# Patient Record
Sex: Male | Born: 1955 | Hispanic: Yes | Marital: Single | Smoking: Former smoker
Health system: Southern US, Community
[De-identification: ages and names within clinical notes are randomized; demographics above are authoritative.]

## PROBLEM LIST (undated history)

## (undated) DIAGNOSIS — I10 Essential (primary) hypertension: Secondary | ICD-10-CM

## (undated) DIAGNOSIS — I2510 Atherosclerotic heart disease of native coronary artery without angina pectoris: Secondary | ICD-10-CM

## (undated) DIAGNOSIS — G8929 Other chronic pain: Secondary | ICD-10-CM

## (undated) DIAGNOSIS — E785 Hyperlipidemia, unspecified: Secondary | ICD-10-CM

## (undated) DIAGNOSIS — I480 Paroxysmal atrial fibrillation: Secondary | ICD-10-CM

## (undated) DIAGNOSIS — I5189 Other ill-defined heart diseases: Secondary | ICD-10-CM

## (undated) HISTORY — DX: Paroxysmal atrial fibrillation: I48.0

## (undated) HISTORY — DX: Hyperlipidemia, unspecified: E78.5

## (undated) HISTORY — DX: Atherosclerotic heart disease of native coronary artery without angina pectoris: I25.10

## (undated) HISTORY — DX: Other ill-defined heart diseases: I51.89

---

## 2016-12-06 ENCOUNTER — Emergency Department
Admission: EM | Admit: 2016-12-06 | Discharge: 2016-12-06 | Disposition: A | Discharge status: Other Acute Inpatient Hospital | Payer: Self-pay | Attending: Emergency Medicine | Admitting: Emergency Medicine

## 2016-12-06 ENCOUNTER — Encounter (HOSPITAL_COMMUNITY)
Admission: EM | Disposition: A | Discharge status: Home / Self Care | Payer: Self-pay | Source: Home / Self Care | Attending: Cardiology

## 2016-12-06 ENCOUNTER — Encounter (HOSPITAL_COMMUNITY): Payer: Self-pay | Admitting: *Deleted

## 2016-12-06 ENCOUNTER — Inpatient Hospital Stay (HOSPITAL_COMMUNITY)
Admission: EM | Admit: 2016-12-06 | Discharge: 2016-12-09 | DRG: 247 | Disposition: A | Discharge status: Home / Self Care | Payer: Self-pay | Attending: Cardiology | Admitting: Cardiology

## 2016-12-06 DIAGNOSIS — E785 Hyperlipidemia, unspecified: Secondary | ICD-10-CM

## 2016-12-06 DIAGNOSIS — K5900 Constipation, unspecified: Secondary | ICD-10-CM | POA: Diagnosis not present

## 2016-12-06 DIAGNOSIS — I2119 ST elevation (STEMI) myocardial infarction involving other coronary artery of inferior wall: Principal | ICD-10-CM | POA: Diagnosis present

## 2016-12-06 DIAGNOSIS — I2510 Atherosclerotic heart disease of native coronary artery without angina pectoris: Secondary | ICD-10-CM

## 2016-12-06 DIAGNOSIS — I480 Paroxysmal atrial fibrillation: Secondary | ICD-10-CM | POA: Diagnosis present

## 2016-12-06 DIAGNOSIS — I213 ST elevation (STEMI) myocardial infarction of unspecified site: Secondary | ICD-10-CM

## 2016-12-06 DIAGNOSIS — I4891 Unspecified atrial fibrillation: Secondary | ICD-10-CM | POA: Diagnosis present

## 2016-12-06 DIAGNOSIS — Z9861 Coronary angioplasty status: Secondary | ICD-10-CM

## 2016-12-06 DIAGNOSIS — I2111 ST elevation (STEMI) myocardial infarction involving right coronary artery: Secondary | ICD-10-CM | POA: Insufficient documentation

## 2016-12-06 DIAGNOSIS — I10 Essential (primary) hypertension: Secondary | ICD-10-CM

## 2016-12-06 HISTORY — DX: Essential (primary) hypertension: I10

## 2016-12-06 HISTORY — DX: Atherosclerotic heart disease of native coronary artery without angina pectoris: I25.10

## 2016-12-06 HISTORY — DX: Other chronic pain: G89.29

## 2016-12-06 HISTORY — PX: CARDIAC CATHETERIZATION: SHX172

## 2016-12-06 LAB — PROTIME-INR
INR: 0.93
Prothrombin Time: 12.5 seconds (ref 11.4–15.2)

## 2016-12-06 LAB — BASIC METABOLIC PANEL
Anion gap: 10 (ref 5–15)
BUN: 19 mg/dL (ref 6–20)
CALCIUM: 8.8 mg/dL — AB (ref 8.9–10.3)
CHLORIDE: 108 mmol/L (ref 101–111)
CO2: 19 mmol/L — ABNORMAL LOW (ref 22–32)
CREATININE: 1.09 mg/dL (ref 0.61–1.24)
GFR calc non Af Amer: >60 mL/min (ref >60)
Glucose, Bld: 123 mg/dL — ABNORMAL HIGH (ref 65–99)
Potassium: 4.9 mmol/L (ref 3.5–5.1)
SODIUM: 137 mmol/L (ref 135–145)

## 2016-12-06 LAB — CBC
HCT: 41.5 % (ref 39.0–52.0)
HEMATOCRIT: 47.3 % (ref 40.0–52.0)
HEMOGLOBIN: 13.9 g/dL (ref 13.0–17.0)
Hemoglobin: 15.8 g/dL (ref 13.0–18.0)
MCH: 30.9 pg (ref 26.0–34.0)
MCH: 30.5 pg (ref 26.0–34.0)
MCHC: 33.4 g/dL (ref 32.0–36.0)
MCHC: 33.5 g/dL (ref 30.0–36.0)
MCV: 92.4 fL (ref 80.0–100.0)
MCV: 91.2 fL (ref 78.0–100.0)
PLATELETS: 235 10*3/uL (ref 150–440)
Platelets: 213 10*3/uL (ref 150–400)
RBC: 5.12 MIL/uL (ref 4.40–5.90)
RBC: 4.55 MIL/uL (ref 4.22–5.81)
RDW: 13.6 % (ref 11.5–14.5)
RDW: 13.5 % (ref 11.5–15.5)
WBC: 9.8 10*3/uL (ref 3.8–10.6)
WBC: 8.4 10*3/uL (ref 4.0–10.5)

## 2016-12-06 LAB — TROPONIN I
TROPONIN I: 30.68 ng/mL — AB (ref <0.03)
TROPONIN I: 60.88 ng/mL — AB (ref <0.03)
Troponin I: 0.11 ng/mL (ref <0.03)

## 2016-12-06 LAB — APTT: APTT: 24 s (ref 24–36)

## 2016-12-06 LAB — MRSA PCR SCREENING: MRSA by PCR: NEGATIVE

## 2016-12-06 LAB — TSH: TSH: 1.463 u[IU]/mL (ref 0.350–4.500)

## 2016-12-06 SURGERY — LEFT HEART CATH AND CORONARY ANGIOGRAPHY

## 2016-12-06 MED ORDER — ONDANSETRON HCL 4 MG/2ML IJ SOLN
4.0000 mg | Freq: Once | INTRAMUSCULAR | Status: AC
  Administered 2016-12-06: 4 mg via INTRAVENOUS

## 2016-12-06 MED ORDER — LIDOCAINE HCL (PF) 1 % IJ SOLN
INTRAMUSCULAR | Status: DC | PRN
  Administered 2016-12-06: 2 mL via INTRADERMAL

## 2016-12-06 MED ORDER — TICAGRELOR 90 MG PO TABS: 90.0000 mg | ORAL_TABLET | Freq: Two times a day (BID) | ORAL | Status: DC

## 2016-12-06 MED ORDER — ASPIRIN 81 MG PO CHEW: 81.0000 mg | CHEWABLE_TABLET | Freq: Every day | ORAL | Status: DC

## 2016-12-06 MED ORDER — TICAGRELOR 90 MG PO TABS
90.0000 mg | ORAL_TABLET | Freq: Two times a day (BID) | ORAL | Status: DC
  Administered 2016-12-06 – 2016-12-09 (×6): 90 mg via ORAL
  Filled 2016-12-06 (×6): qty 1

## 2016-12-06 MED ORDER — ASPIRIN 81 MG PO CHEW
81.0000 mg | CHEWABLE_TABLET | Freq: Every day | ORAL | Status: DC
  Administered 2016-12-07 – 2016-12-09 (×3): 81 mg via ORAL
  Filled 2016-12-06 (×3): qty 1

## 2016-12-06 MED ORDER — VERAPAMIL HCL 2.5 MG/ML IV SOLN
INTRAVENOUS | Status: AC
  Filled 2016-12-06: qty 2

## 2016-12-06 MED ORDER — ONDANSETRON HCL 4 MG/2ML IJ SOLN
INTRAMUSCULAR | Status: DC | PRN
  Administered 2016-12-06: 4 mg via INTRAVENOUS

## 2016-12-06 MED ORDER — ATORVASTATIN CALCIUM 80 MG PO TABS: 80.0000 mg | ORAL_TABLET | Freq: Every day | ORAL | Status: DC

## 2016-12-06 MED ORDER — PANTOPRAZOLE SODIUM 40 MG PO TBEC
40.0000 mg | DELAYED_RELEASE_TABLET | Freq: Every day | ORAL | Status: DC
  Administered 2016-12-06 – 2016-12-09 (×4): 40 mg via ORAL
  Filled 2016-12-06 (×4): qty 1

## 2016-12-06 MED ORDER — SODIUM CHLORIDE 0.9 % IV SOLN
INTRAVENOUS | Status: DC | PRN
  Administered 2016-12-06: 100 mL/h via INTRAVENOUS

## 2016-12-06 MED ORDER — ASPIRIN 81 MG PO CHEW
324.0000 mg | CHEWABLE_TABLET | Freq: Once | ORAL | Status: AC
  Administered 2016-12-06: 324 mg via ORAL

## 2016-12-06 MED ORDER — IOPAMIDOL (ISOVUE-370) INJECTION 76%
INTRAVENOUS | Status: DC | PRN
  Administered 2016-12-06: 125 mL via INTRA_ARTERIAL

## 2016-12-06 MED ORDER — ZOLPIDEM TARTRATE 5 MG PO TABS: 5.0000 mg | ORAL_TABLET | Freq: Every evening | ORAL | Status: DC | PRN

## 2016-12-06 MED ORDER — SODIUM CHLORIDE 0.9% FLUSH
3.0000 mL | Freq: Two times a day (BID) | INTRAVENOUS | Status: DC
  Administered 2016-12-06 – 2016-12-08 (×4): 3 mL via INTRAVENOUS

## 2016-12-06 MED ORDER — ALPRAZOLAM 0.25 MG PO TABS: 0.2500 mg | ORAL_TABLET | Freq: Two times a day (BID) | ORAL | Status: DC | PRN

## 2016-12-06 MED ORDER — NITROGLYCERIN 1 MG/10 ML FOR IR/CATH LAB
INTRA_ARTERIAL | Status: AC
  Filled 2016-12-06: qty 10

## 2016-12-06 MED ORDER — ONDANSETRON HCL 4 MG/2ML IJ SOLN: 4.0000 mg | Freq: Four times a day (QID) | INTRAMUSCULAR | Status: DC | PRN

## 2016-12-06 MED ORDER — SODIUM CHLORIDE 0.9 % WEIGHT BASED INFUSION
1.0000 mL/kg/h | INTRAVENOUS | Status: AC
  Administered 2016-12-06: 1 mL/kg/h via INTRAVENOUS

## 2016-12-06 MED ORDER — ATORVASTATIN CALCIUM 80 MG PO TABS
80.0000 mg | ORAL_TABLET | Freq: Every day | ORAL | Status: DC
  Administered 2016-12-06 – 2016-12-08 (×3): 80 mg via ORAL
  Filled 2016-12-06 (×3): qty 1

## 2016-12-06 MED ORDER — MUPIROCIN 2 % EX OINT: 1.0000 "application " | TOPICAL_OINTMENT | Freq: Two times a day (BID) | CUTANEOUS | Status: DC

## 2016-12-06 MED ORDER — ENOXAPARIN SODIUM 40 MG/0.4ML ~~LOC~~ SOLN: 40.0000 mg | SUBCUTANEOUS | Status: DC

## 2016-12-06 MED ORDER — SODIUM CHLORIDE 0.9% FLUSH: 3.0000 mL | INTRAVENOUS | Status: DC | PRN

## 2016-12-06 MED ORDER — HEPARIN SODIUM (PORCINE) 1000 UNIT/ML IJ SOLN
INTRAMUSCULAR | Status: AC
  Filled 2016-12-06: qty 1

## 2016-12-06 MED ORDER — ACETAMINOPHEN 325 MG PO TABS: 650.0000 mg | ORAL_TABLET | ORAL | Status: DC | PRN

## 2016-12-06 MED ORDER — NITROGLYCERIN 1 MG/10 ML FOR IR/CATH LAB
INTRA_ARTERIAL | Status: DC | PRN
  Administered 2016-12-06 (×2): 200 ug via INTRACORONARY

## 2016-12-06 MED ORDER — TICAGRELOR 90 MG PO TABS
ORAL_TABLET | ORAL | Status: AC
  Filled 2016-12-06: qty 1

## 2016-12-06 MED ORDER — HEPARIN (PORCINE) IN NACL 2-0.9 UNIT/ML-% IJ SOLN
INTRAMUSCULAR | Status: DC | PRN
  Administered 2016-12-06: 1000 mL via INTRA_ARTERIAL

## 2016-12-06 MED ORDER — LIDOCAINE HCL (PF) 1 % IJ SOLN
INTRAMUSCULAR | Status: AC
  Filled 2016-12-06: qty 30

## 2016-12-06 MED ORDER — HEPARIN (PORCINE) IN NACL 2-0.9 UNIT/ML-% IJ SOLN
INTRAMUSCULAR | Status: AC
  Filled 2016-12-06: qty 1000

## 2016-12-06 MED ORDER — HEPARIN (PORCINE) IN NACL 100-0.45 UNIT/ML-% IJ SOLN
1400.0000 [IU]/h | INTRAMUSCULAR | Status: DC
  Administered 2016-12-06: 1350 [IU]/h via INTRAVENOUS
  Administered 2016-12-07: 1500 [IU]/h via INTRAVENOUS
  Administered 2016-12-08: 1400 [IU]/h via INTRAVENOUS
  Filled 2016-12-06 (×3): qty 250

## 2016-12-06 MED ORDER — ONDANSETRON HCL 4 MG/2ML IJ SOLN
INTRAMUSCULAR | Status: AC
  Filled 2016-12-06: qty 2

## 2016-12-06 MED ORDER — VERAPAMIL HCL 2.5 MG/ML IV SOLN
INTRAVENOUS | Status: DC | PRN
  Administered 2016-12-06: 11:00:00 via INTRA_ARTERIAL

## 2016-12-06 MED ORDER — NITROGLYCERIN 0.4 MG SL SUBL
0.4000 mg | SUBLINGUAL_TABLET | SUBLINGUAL | Status: DC | PRN
  Administered 2016-12-06 (×2): 0.4 mg via SUBLINGUAL

## 2016-12-06 MED ORDER — NITROGLYCERIN 0.4 MG SL SUBL: 0.4000 mg | SUBLINGUAL_TABLET | SUBLINGUAL | Status: DC | PRN

## 2016-12-06 MED ORDER — TICAGRELOR 90 MG PO TABS
ORAL_TABLET | ORAL | Status: DC | PRN
  Administered 2016-12-06: 180 mg via ORAL

## 2016-12-06 MED ORDER — SODIUM CHLORIDE 0.9 % IV SOLN: 250.0000 mL | INTRAVENOUS | Status: DC | PRN

## 2016-12-06 MED ORDER — CHLORHEXIDINE GLUCONATE CLOTH 2 % EX PADS: 6.0000 | MEDICATED_PAD | Freq: Every day | CUTANEOUS | Status: DC

## 2016-12-06 MED ORDER — SODIUM CHLORIDE 0.9% FLUSH
3.0000 mL | Freq: Two times a day (BID) | INTRAVENOUS | Status: DC
  Administered 2016-12-06 – 2016-12-07 (×3): 3 mL via INTRAVENOUS

## 2016-12-06 MED ORDER — HEPARIN (PORCINE) IN NACL 100-0.45 UNIT/ML-% IJ SOLN
1150.0000 [IU]/h | INTRAMUSCULAR | Status: DC
  Filled 2016-12-06: qty 250

## 2016-12-06 MED ORDER — VERAPAMIL HCL 2.5 MG/ML IV SOLN
INTRAVENOUS | Status: DC | PRN
  Administered 2016-12-06: 200 ug via INTRACORONARY

## 2016-12-06 MED ORDER — HEPARIN SODIUM (PORCINE) 5000 UNIT/ML IJ SOLN
4000.0000 [IU] | INTRAMUSCULAR | Status: AC
  Administered 2016-12-06: 4000 [IU] via INTRAVENOUS

## 2016-12-06 MED ORDER — HEPARIN SODIUM (PORCINE) 1000 UNIT/ML IJ SOLN
INTRAMUSCULAR | Status: DC | PRN
  Administered 2016-12-06: 2000 [IU] via INTRAVENOUS
  Administered 2016-12-06: 6000 [IU] via INTRAVENOUS
  Administered 2016-12-06: 3000 [IU] via INTRAVENOUS

## 2016-12-06 SURGICAL SUPPLY — 17 items
BALLN MOZEC 2.50X14 (BALLOONS) ×3
BALLOON MOZEC 2.50X14 (BALLOONS) ×1 IMPLANT
CATH DIAG 6FR PIGTAIL ANGLED (CATHETERS) ×3 IMPLANT
CATH EXPO 5F FL3.5 (CATHETERS) ×3 IMPLANT
CATH INFINITI JR4 5F (CATHETERS) ×3 IMPLANT
CATH VISTA GUIDE 6FR JR4 (CATHETERS) ×3 IMPLANT
GLIDESHEATH SLEND SS 6F .021 (SHEATH) ×3 IMPLANT
GUIDEWIRE INQWIRE 1.5J.035X260 (WIRE) ×1 IMPLANT
INQWIRE 1.5J .035X260CM (WIRE) ×3
KIT ENCORE 26 ADVANTAGE (KITS) ×3 IMPLANT
KIT HEART LEFT (KITS) ×3 IMPLANT
PACK CARDIAC CATHETERIZATION (CUSTOM PROCEDURE TRAY) ×3 IMPLANT
STENT PROMUS PREM MR 3.5X24 (Permanent Stent) ×3 IMPLANT
SYR MEDRAD MARK V 150ML (SYRINGE) ×3 IMPLANT
TRANSDUCER W/STOPCOCK (MISCELLANEOUS) ×3 IMPLANT
TUBING CIL FLEX 10 FLL-RA (TUBING) ×3 IMPLANT
WIRE ASAHI PROWATER 180CM (WIRE) ×3 IMPLANT

## 2016-12-06 NOTE — H&P (Signed)
History and Physical   Patient ID: Andrew Palmer MRN: [id number], DOB/AGE: 1956-02-08 60 y.o. Date of Encounter: 12/06/2016  Primary Physician: No primary care provider on file. Primary Cardiologist: New  Chief Complaint:  STEMI  HPI: Andrew Palmer is a 60 y.o. male with no previous cardiac history or evaluation. He has not had a checkup in > 20 years. Never been told he has HTN, DM, HLD.    He speaks only [place], interpreter phone was used.   He developed chest pain last pm. When symptoms did not resolve overnight and the pain kept getting worse, he went to [facility] ER. His ECG was consistent with inferior STEMI, pt [facility] emerg[facility] and taken to the cath lab.  Upon arrival, Andrew Palmer is still having 2/10 chest pain. It was 5/10 at [facility]. He denies SOB, is a little nauseated. No diaphoresis.   He was given ASA 324 mg, heparin 4000 U bolus, Zofran 4 mg, SL NTG x 2 prior to arrival at the cath lab.    Past Medical History:  Diagnosis Date  . Chronic pain   . HTN, goal below 130/80 12/06/2016  . ST elevation (STEMI) myocardial infarction involving right coronary artery (HCC) 12/06/2016    Surgical History: No past surgical history on file.   I have reviewed the patient's current medications. Prior to Admission medications   None   Scheduled Meds: Continuous Infusions: PRN Meds:.  Allergies: No Known Allergies  Social History   Social History  . Marital status: Single    Spouse name: N/A  . Number of children: N/A  . Years of education: N/A   Occupational History  . Construction    Social History Main Topics  . Smoking status: Never Smoker  . Smokeless tobacco: Never Used  . Alcohol use No  . Drug use: No  . Sexual activity: Not on file   Other Topics Concern  . Not on file   Social History Narrative  . No narrative on file    Family History  Problem Relation Age of Onset  . CAD Neg Hx    Family Status   Relation Status  . Neg Hx     Review of Systems:   Full 14-point review of systems otherwise negative except as noted above.  Physical Exam: SpO2 100 %. General: Well developed, well nourished,male in acute distress. Head: Normocephalic, atraumatic, sclera non-icteric, no xanthomas, nares are without discharge. Dentition: poor Neck: No carotid bruits. JVD not elevated. No thyromegally Lungs: Good expansion bilaterally. without wheezes or rhonchi.  Heart: Regular rate and rhythm with S1 S2.  No S3 or S4.  No murmur, no rubs, or gallops appreciated. Abdomen: Soft, non-tender, non-distended with normoactive bowel sounds. No hepatomegaly. No rebound/guarding. No obvious abdominal masses. Msk:  Strength and tone appear normal for age. No joint deformities or effusions, no spine or costo-vertebral angle tenderness. Extremities: No clubbing or cyanosis. No edema.  Distal pedal pulses are 2+ in 3/4 extrem,  decreased R-DP, normal R-PT, no femoral bruit Neuro: Alert and oriented X 3. Moves all extremities spontaneously. No focal deficits noted. Psych:  Responds to questions appropriately with a normal affect. Skin: No rashes or lesions noted  Labs:   Lab Results  Component Value Date   WBC 9.8 12/06/2016   HGB 15.8 12/06/2016   HCT 47.3 12/06/2016   MCV 92.4 12/06/2016   PLT 235 12/06/2016    Recent Labs  12/06/16 0950  INR 0.93  Radiology/Studies: No results found.   Cardiac Cath: pending  Echo: n/a  ECG: Atrial fibrillation with controlled rate, Inferior ST elevation  ASSESSMENT AND PLAN:  Principal Problem:   ST elevation (STEMI) myocardial infarction involving right coronary artery (HCC) - emergent cath w/ further evaluation and treatment depending on the results - screen for CRFs - add high-dose statin and BB - ck echo  Active Problems:   HTN, goal below 130/80 - add BB and ACE/ARB if renal function ok.  - Atrial fibrillation. Probably related to STEMI. Will  control rate and anticoagulate. Check Echo.   Tawny AsalSigned, Barrett, Rhonda, PA-C 12/06/2016 11:17 AM Beeper [phone]  Patient seen and examined and history reviewed. Agree with above findings and plan. 60 yo Hispanic male transferred from [facility] ED with acute inferior STEMI. Also in AFib with controlled rate. No known medical history. Persistent chest pain and nausea. Will proceed directly with emergent cardiac cath/PCI.   Peter SwazilandJordan, MDFACC 12/06/2016 11:33 AM

## 2016-12-06 NOTE — ED Notes (Signed)
Pt pulled from front desk line with reports of chest pain, pt placed on EKG monitor.

## 2016-12-06 NOTE — ED Notes (Signed)
Interpreter paged and used at bedside for translation. Family at bedside. MD at bedside.

## 2016-12-06 NOTE — ED Notes (Signed)
Transferred to [facility] by EMS

## 2016-12-06 NOTE — ED Provider Notes (Signed)
[facility]-EMERGENCY DEPT Provider Note   CSN: [id number] Arrival date & time: 12/06/16  1020     History   Chief Complaint No chief complaint on file. STEMI   HPI Andrew Palmer is a 60 y.o. male with no significant past medical history who presents as a transfer from [facility] for STEMI. Due to Cath Lab currently being in use, patient was directed to the emergency department for initial evaluation. According to EMS, patient is having a 5 out of 10 chest pain upon arrival. Patient has received 4000 units of heparin, 4 mg of Zofran, aspirin, and 2 nitroglycerin tablets. Patient reports that he started having pain at 11 PM last night and has had intermittently ever since. Patient went to elementary shoulder he was found to have an ST elevation MI with elevations in the inferior leads. She is denying other complaints at the moment including no abdominal pain, headache, or extremity pain.    The history is provided by the patient and the EMS personnel. The history is limited by a language barrier. A language interpreter was used.  Chest Pain   This is a new problem. The current episode started 6 to 12 hours ago. The problem occurs constantly. The problem has been gradually worsening. The pain is present in the substernal region. The pain is at a severity of 5/10. The pain is moderate. The quality of the pain is described as heavy and pressure-like. The pain does not radiate. Associated symptoms include nausea. Pertinent negatives include no abdominal pain, no back pain, no fever and no headaches. Risk factors include obesity and male gender.  Pertinent negatives for family medical history include: no CAD.    Past Medical History:  Diagnosis Date  . Chronic pain     There are no active problems to display for this patient.   No past surgical history on file.     Home Medications    Prior to Admission medications   Not on File    Family History No family history on  file.  Social History Social History  Substance Use Topics  . Smoking status: Not on file  . Smokeless tobacco: Not on file  . Alcohol use Not on file     Allergies   Patient has no known allergies.   Review of Systems Review of Systems  Constitutional: Negative for fever.  HENT: Negative for rhinorrhea.   Eyes: Negative for visual disturbance.  Respiratory: Positive for chest tightness.   Cardiovascular: Positive for chest pain.  Gastrointestinal: Positive for nausea. Negative for abdominal pain.  Genitourinary: Negative for dysuria.  Musculoskeletal: Negative for back pain.  Skin: Negative for rash.  Neurological: Negative for headaches.  All other systems reviewed and are negative.    Physical Exam Updated Vital Signs There were no vitals taken for this visit.  Physical Exam  Constitutional: He is oriented to person, place, and time. He appears well-developed and well-nourished.  HENT:  Head: Normocephalic and atraumatic.  Eyes: Conjunctivae are normal.  Neck: Neck supple.  Cardiovascular: Normal rate, regular rhythm, normal heart sounds and intact distal pulses.   No murmur heard. Pulmonary/Chest: Effort normal and breath sounds normal. No respiratory distress. He has no wheezes. He has no rales.  Abdominal: Soft. There is no tenderness.  Musculoskeletal: He exhibits no edema.  Neurological: He is alert and oriented to person, place, and time. He exhibits normal muscle tone.  Skin: Skin is warm and dry. Capillary refill takes less than 2 seconds. No erythema.  No pallor.  Psychiatric: He has a normal mood and affect.  Nursing note and vitals reviewed.    ED Treatments / Results  Labs (all labs ordered are listed, but only abnormal results are displayed) Labs Reviewed  BASIC METABOLIC PANEL - Abnormal; Notable for the following:       Result Value   CO2 19 (*)    Glucose, Bld 123 (*)    Calcium 8.8 (*)    All other components within normal limits   TROPONIN I - Abnormal; Notable for the following:    Troponin I 30.68 (*)    All other components within normal limits  HEMOGLOBIN A1C - Abnormal; Notable for the following:    Hgb A1c MFr Bld 6.4 (*)    All other components within normal limits  TROPONIN I - Abnormal; Notable for the following:    Troponin I 60.88 (*)    All other components within normal limits  TROPONIN I - Abnormal; Notable for the following:    Troponin I >65.00 (*)    All other components within normal limits  HEPARIN LEVEL (UNFRACTIONATED) - Abnormal; Notable for the following:    Heparin Unfractionated 0.20 (*)    All other components within normal limits  COMPREHENSIVE METABOLIC PANEL - Abnormal; Notable for the following:    Glucose, Bld 118 (*)    Calcium 8.8 (*)    AST 218 (*)    All other components within normal limits  LIPID PANEL - Abnormal; Notable for the following:    Triglycerides 246 (*)    HDL 38 (*)    VLDL 49 (*)    LDL Cholesterol 106 (*)    All other components within normal limits  MRSA PCR SCREENING  CBC  TSH  CBC  HEPARIN LEVEL (UNFRACTIONATED)    EKG  EKG Interpretation None       Radiology No results found.  Procedures Procedures (including critical care time)  Medications Ordered in ED Medications  nitroGLYCERIN (NITROSTAT) SL tablet 0.4 mg (not administered)  ALPRAZolam (XANAX) tablet 0.25 mg (not administered)  sodium chloride flush (NS) 0.9 % injection 3 mL (0 mLs Intravenous Duplicate 12/07/16 1000)  sodium chloride flush (NS) 0.9 % injection 3 mL (not administered)  0.9 %  sodium chloride infusion (not administered)  zolpidem (AMBIEN) tablet 5 mg (not administered)  sodium chloride flush (NS) 0.9 % injection 3 mL (3 mLs Intravenous Given 12/07/16 1000)  sodium chloride flush (NS) 0.9 % injection 3 mL (not administered)  0.9 %  sodium chloride infusion (not administered)  acetaminophen (TYLENOL) tablet 650 mg (not administered)  ondansetron (ZOFRAN)  injection 4 mg (not administered)  0.9% sodium chloride infusion (0 mL/kg/hr  116 kg Intravenous Stopped 12/06/16 2215)  pantoprazole (PROTONIX) EC tablet 40 mg (40 mg Oral Given 12/07/16 1023)  aspirin chewable tablet 81 mg (81 mg Oral Given 12/07/16 1023)  ticagrelor (BRILINTA) tablet 90 mg (90 mg Oral Given 12/07/16 1023)  atorvastatin (LIPITOR) tablet 80 mg (80 mg Oral Given 12/06/16 1711)  heparin ADULT infusion 100 units/mL (25000 units/23250mL sodium chloride 0.45%) (1,500 Units/hr Intravenous Rate/Dose Change 12/07/16 0202)  perflutren lipid microspheres (DEFINITY) IV suspension (4 mLs Intravenous Given 12/07/16 0945)  metoprolol tartrate (LOPRESSOR) tablet 25 mg (25 mg Oral Given 12/07/16 1023)  PERFLUTREN LIPID MICROSPHERE injection SUSP (not administered)     Initial Impression / Assessment and Plan / ED Course  I have reviewed the triage vital signs and the nursing notes.  Pertinent labs & imaging  results that were available during my care of the patient were reviewed by me and considered in my medical decision making (see chart for details).  Clinical Course     Claus Silvestro is a 60 y.o. male with no significant past medical history who presents as a transfer from [facility] for STEMI.  Patient quickly taken to exam room and evaluated by me. Patient had clear lungs and nontender chest. Abdomen nontender. Patient has symmetric pulses bilaterally. Interpreter came to the bedside to allow for history gathering.  Shortly after patient was roomed, nursing reports that the Cath Lab is ready for the patient.   Patient was quickly packed up and taken to the Cath Lab for intervention.  Patient went to the Cath Lab without further application and he was not hypotensive prior to transport.    Final Clinical Impressions(s) / ED Diagnoses   Final diagnoses:  ST elevation myocardial infarction (STEMI), unspecified artery (HCC)  No diagnosis found.  Clinical  Impression:   Disposition: Admit to Cath Lab with Cardiology    Heide Scales, MD 12/07/16 1136

## 2016-12-06 NOTE — Progress Notes (Signed)
ANTICOAGULATION CONSULT NOTE - Initial Consult  Pharmacy Consult for Heparin Indication: atrial fibrillation  No Known Allergies  Patient Measurements: Height: 5\' 4"  (162.6 cm) Weight: 235 lb 10.8 oz (106.9 kg) IBW/kg (Calculated) : 59.2 Heparin Dosing Weight: ~84kg  Vital Signs: Temp: 98.1 F (36.7 C) (12/10 1530) Temp Source: Oral (12/10 1530) BP: 142/98 (12/10 1530) Pulse Rate: 102 (12/10 1530)  Labs:  Recent Labs  12/06/16 0950 12/06/16 1310  HGB 15.8 13.9  HCT 47.3 41.5  PLT 235 213  APTT 24  --   LABPROT 12.5  --   INR 0.93  --   CREATININE  --  1.09  TROPONINI 0.11* 30.68*    Estimated Creatinine Clearance: 80.8 mL/min (by C-G formula based on SCr of 1.09 mg/dL).   Medical History: Past Medical History:  Diagnosis Date  . Chronic pain   . HTN, goal below 130/80 12/06/2016  . ST elevation (STEMI) myocardial infarction involving right coronary artery (HCC) 12/06/2016   Assessment: 60yom presented as Code STEMI, now s/p cath found to have 100% occlusion to mid RCA and received a DES.  Also noted to have afib during the procedure. He will begin heparin post cath with eventual transition to oral anticoagulation. Heparin to begin 8 hours post sheath removal - radial sheath removed at 1125, TR band deflated and off ~ 1515.  Goal of Therapy:  Heparin level 0.3-0.7 units/ml Monitor platelets by anticoagulation protocol: Yes   Plan:  1) At ~1930, begin heparin at 1350 units/hr with no bolus 2) Check 6 hour heparin level 3) Daily heparin level and CBC  Fredrik RiggerMarkle, Yona Stansbury Sue 12/06/2016,3:59 PM

## 2016-12-06 NOTE — ED Triage Notes (Signed)
Per EMS- pt is a transfer from [facility] hospital. Pt arrived with chest pain that started last night and resolved but returned at 0430. Pt received 4mg  zofran, 4000 of heparin, 2 nitro and 324mg  of asprin. Pt was alert and oriented upon arrival. 2 IV established prior to arrival. NS infusing KVO. Pt was taken to cath lab immediately after transferring to ED stretcher. VS with EMS were 148/80 BP, HR 76 and irregular, and 96% on RA, Resp. 18. Pt was transported to Cath lab with this RN and EMS on radiolucent zoll pads and monitor.

## 2016-12-06 NOTE — Progress Notes (Signed)
Cardiology made aware about troponin 30.68. Will continue to monitor patient closely.

## 2016-12-06 NOTE — ED Triage Notes (Signed)
Pt from home POV, reports that pt began having chest pain at first around 1130pm last night, pain subsided and began again around 0430 this am.

## 2016-12-06 NOTE — ED Provider Notes (Signed)
[facility] Emergency Department Provider Note ____________________________________________   I have reviewed the triage vital signs and the triage nursing note.  HISTORY  Chief Complaint Chest Pain   Historian Patient and daughter, through [employer] interpreter  HPI Andrew Palmer is a 60 y.o. male who does not see a doctor, who started having intermittent chest pains at 11:30 last night. He has a worsening episode this morning around 4:30 AM. Central chest pain about 5 out of 10 in intensity. Just described as pain. No shortness of breath. Mild nausea without vomiting. No abdominal pain. No extension or radiation of the pain. Brought him in private vehicle for evaluation.  Currently pain is 4-5 out of 10.    Past Medical History:  Diagnosis Date  . Chronic pain     There are no active problems to display for this patient.   No past surgical history on file.  Prior to Admission medications   Not on File  Occasional tramadol which he buys on the street for chronic right shoulder pain.  No Known Allergies  No family history on file.  Social History Social History  Substance Use Topics  . Smoking status: Not on file  . Smokeless tobacco: Not on file  . Alcohol use Not on file    Review of Systems  Constitutional: Negative for fever. Eyes: Negative for visual changes. ENT: Negative for sore throat. Cardiovascular: Positive for chest pain. Respiratory: Negative for shortness of breath. Gastrointestinal: Negative for abdominal pain, vomiting and diarrhea. Genitourinary: Negative for dysuria. Musculoskeletal: Negative for back pain. Skin: Negative for rash. Neurological: Negative for headache. 10 point Review of Systems otherwise negative ____________________________________________   PHYSICAL EXAM:  VITAL SIGNS: ED Triage Vitals  Enc Vitals Group     BP 12/06/16 0946 (!) 177/125     Pulse Rate 12/06/16 0946 (!) 105     Resp  12/06/16 0946 20     Temp 12/06/16 0946 97 F (36.1 C)     Temp Source 12/06/16 0946 Oral     SpO2 12/06/16 0946 98 %     Weight 12/06/16 0947 255 lb 11.7 oz (116 kg)     Height 12/06/16 0947 _0  (1.702 m)     Head Circumference --      Peak Flow --      Pain Score 12/06/16 0947 8     Pain Loc --      Pain Edu? --      Excl. in [place]? --      Constitutional: Alert and oriented. Well appearing and in no distress. HEENT   Head: Normocephalic and atraumatic.      Eyes: Conjunctivae are normal. PERRL. Normal extraocular movements.      Ears:         Nose: No congestion/rhinnorhea.   Mouth/Throat: Mucous membranes are moist.  Poor dentition, multiple missing teeth.   Neck: No stridor. Cardiovascular/Chest: Normal rate, regular rhythm.  No murmurs, rubs, or gallops. Respiratory: Normal respiratory effort without tachypnea nor retractions. Breath sounds are clear and equal bilaterally. No wheezes/rales/rhonchi. Gastrointestinal: Soft. No distention, no guarding, no rebound. Nontender.  Obese  Genitourinary/rectal:Deferred Musculoskeletal: Nontender with normal range of motion in all extremities. No joint effusions.  No lower extremity tenderness.  No edema. Neurologic:  Normal speech and language. No gross or focal neurologic deficits are appreciated. Skin:  Skin is warm, dry and intact. No rash noted. Psychiatric: Mood and affect are normal. Speech and behavior are normal. Patient exhibits  appropriate insight and judgment.   ____________________________________________  LABS (pertinent positives/negatives)  Labs Reviewed  CBC  APTT  PROTIME-INR  TROPONIN I    ____________________________________________    EKG I, Lisa Roca, MD, the attending physician have personally viewed and interpreted all ECGs.  81 bpm. Irregularly irregular likely consistent with atrial fibrillation. Narrow QRS. Normal axis. ST segment elevation in 3 and aVF with ST depression in 1 and  aVL. Consistent with inferior STEMI ____________________________________________  RADIOLOGY All Xrays were viewed by me. Imaging interpreted by Radiologist.  None __________________________________________  PROCEDURES  Procedure(s) performed: None  Critical Care performed: None  ____________________________________________   ED COURSE / ASSESSMENT AND PLAN  Pertinent labs & imaging results that were available during my care of the patient were reviewed by me and considered in my medical decision making (see chart for details).   I met the patient as the room as the patient had complained of chest pain with triage EKG showing inferior STEMI. I confirmed this and initiated consult for STEMI transfer to [facility].  Patient with a good blood pressure. Symptoms stuttering but worse this morning around 4:30 AM. Through Spanish interpreter I was able to obtain history and also give patient and family information regarding patient's diagnosis and need for transfer for definitive treatment.  He was given aspirin po and heparin bolus by IV.  He was given sublingual nitroglycerin and initially after one dose chest pain down to 2 out of 10. He was given a second dose.  At this point in time [facility] was available for transport and at the same time I spoke with the cardiologist Dr. Martinique who accepted this patient in STEMI transfer.  Patient reevaluated with improved chest discomfort, stable blood pressure and oxygenation and respiratory status.   CONSULTATIONS:   I spoke with Dr. Martinique, interventional cardiologist at [facility] who accepted in transfer as STEMI to Cath Lab.   Patient / Family / Caregiver informed of clinical course, medical decision-making process, and agree with plan.   ___________________________________________   FINAL CLINICAL IMPRESSION(S) / ED DIAGNOSES   Final diagnoses:  Acute ST elevation myocardial infarction (STEMI) of inferior wall [facility])               Note: This dictation was prepared with Dragon dictation. Any transcriptional errors that result from this process are unintentional    Lisa Roca, MD 12/06/16 1009

## 2016-12-07 ENCOUNTER — Inpatient Hospital Stay (HOSPITAL_COMMUNITY): Payer: Self-pay

## 2016-12-07 ENCOUNTER — Encounter (HOSPITAL_COMMUNITY): Payer: Self-pay | Admitting: Cardiology

## 2016-12-07 DIAGNOSIS — Z9861 Coronary angioplasty status: Secondary | ICD-10-CM

## 2016-12-07 DIAGNOSIS — I2510 Atherosclerotic heart disease of native coronary artery without angina pectoris: Secondary | ICD-10-CM

## 2016-12-07 DIAGNOSIS — I480 Paroxysmal atrial fibrillation: Secondary | ICD-10-CM

## 2016-12-07 DIAGNOSIS — I4891 Unspecified atrial fibrillation: Secondary | ICD-10-CM

## 2016-12-07 DIAGNOSIS — E785 Hyperlipidemia, unspecified: Secondary | ICD-10-CM

## 2016-12-07 LAB — COMPREHENSIVE METABOLIC PANEL
ALBUMIN: 3.8 g/dL (ref 3.5–5.0)
ALK PHOS: 44 U/L (ref 38–126)
ALT: 43 U/L (ref 17–63)
AST: 218 U/L — AB (ref 15–41)
Anion gap: 9 (ref 5–15)
BILIRUBIN TOTAL: 0.5 mg/dL (ref 0.3–1.2)
BUN: 17 mg/dL (ref 6–20)
CALCIUM: 8.8 mg/dL — AB (ref 8.9–10.3)
CO2: 22 mmol/L (ref 22–32)
Chloride: 107 mmol/L (ref 101–111)
Creatinine, Ser: 1.1 mg/dL (ref 0.61–1.24)
GFR calc Af Amer: >60 mL/min (ref >60)
GFR calc non Af Amer: >60 mL/min (ref >60)
GLUCOSE: 118 mg/dL — AB (ref 65–99)
Potassium: 3.8 mmol/L (ref 3.5–5.1)
Sodium: 138 mmol/L (ref 135–145)
TOTAL PROTEIN: 6.8 g/dL (ref 6.5–8.1)

## 2016-12-07 LAB — HEPARIN LEVEL (UNFRACTIONATED)
Heparin Unfractionated: 0.2 IU/mL — ABNORMAL LOW (ref 0.30–0.70)
Heparin Unfractionated: 0.38 IU/mL (ref 0.30–0.70)
Heparin Unfractionated: 0.47 IU/mL (ref 0.30–0.70)

## 2016-12-07 LAB — LIPID PANEL
Cholesterol: 193 mg/dL (ref 0–200)
HDL: 38 mg/dL — ABNORMAL LOW (ref >40)
LDL CALC: 106 mg/dL — AB (ref 0–99)
TRIGLYCERIDES: 246 mg/dL — AB (ref <150)
Total CHOL/HDL Ratio: 5.1 RATIO
VLDL: 49 mg/dL — ABNORMAL HIGH (ref 0–40)

## 2016-12-07 LAB — POCT I-STAT, CHEM 8
BUN: 21 mg/dL — ABNORMAL HIGH (ref 6–20)
CREATININE: 0.8 mg/dL (ref 0.61–1.24)
Calcium, Ion: 1.18 mmol/L (ref 1.15–1.40)
Chloride: 105 mmol/L (ref 101–111)
Glucose, Bld: 145 mg/dL — ABNORMAL HIGH (ref 65–99)
HEMATOCRIT: 41 % (ref 39.0–52.0)
HEMOGLOBIN: 13.9 g/dL (ref 13.0–17.0)
POTASSIUM: 4.2 mmol/L (ref 3.5–5.1)
Sodium: 139 mmol/L (ref 135–145)
TCO2: 22 mmol/L (ref 0–100)

## 2016-12-07 LAB — POCT ACTIVATED CLOTTING TIME
ACTIVATED CLOTTING TIME: 202 s
Activated Clotting Time: 230 seconds

## 2016-12-07 LAB — ECHOCARDIOGRAM COMPLETE
Height: 64 in
Weight: 3731.95 oz

## 2016-12-07 LAB — CBC
HCT: 40.3 % (ref 39.0–52.0)
Hemoglobin: 13.6 g/dL (ref 13.0–17.0)
MCH: 30.6 pg (ref 26.0–34.0)
MCHC: 33.7 g/dL (ref 30.0–36.0)
MCV: 90.8 fL (ref 78.0–100.0)
Platelets: 202 10*3/uL (ref 150–400)
RBC: 4.44 MIL/uL (ref 4.22–5.81)
RDW: 13.5 % (ref 11.5–15.5)
WBC: 8.3 10*3/uL (ref 4.0–10.5)

## 2016-12-07 LAB — TROPONIN I: Troponin I: >65 ng/mL (ref <0.03)

## 2016-12-07 LAB — HEMOGLOBIN A1C
Hgb A1c MFr Bld: 6.4 % — ABNORMAL HIGH (ref 4.8–5.6)
MEAN PLASMA GLUCOSE: 137 mg/dL

## 2016-12-07 MED ORDER — PERFLUTREN LIPID MICROSPHERE
INTRAVENOUS | Status: AC
  Filled 2016-12-07: qty 10

## 2016-12-07 MED ORDER — PERFLUTREN LIPID MICROSPHERE
1.0000 mL | INTRAVENOUS | Status: AC | PRN
  Administered 2016-12-07: 4 mL via INTRAVENOUS
  Filled 2016-12-07: qty 10

## 2016-12-07 MED ORDER — METOPROLOL TARTRATE 25 MG PO TABS
25.0000 mg | ORAL_TABLET | Freq: Two times a day (BID) | ORAL | Status: DC
  Administered 2016-12-07 – 2016-12-09 (×5): 25 mg via ORAL
  Filled 2016-12-07 (×5): qty 1

## 2016-12-07 NOTE — Progress Notes (Signed)
CARDIAC REHAB PHASE I   PRE:  Rate/Rhythm: 88 SR    BP: sitting 145/97    SaO2: 97 RA  MODE:  Ambulation: 340 ft   POST:  Rate/Rhythm: 105 ST    BP: sitting 139/82     SaO2: 97 RA   tolerated well, no c/o, sts he feels well. To recliner, set up MI video in Spanish. I will bring him education materials later. He will need an interpreter for education discussion. 1010-1050  Harriet MassonRandi Kristan Keryl Gholson CES, ACSM 12/07/2016 10:49 AM

## 2016-12-07 NOTE — Progress Notes (Signed)
ANTICOAGULATION CONSULT NOTE  Pharmacy Consult for Heparin Indication: atrial fibrillation  No Known Allergies  Patient Measurements: Height: 5\' 4"  (162.6 cm) Weight: 235 lb 10.8 oz (106.9 kg) IBW/kg (Calculated) : 59.2 Heparin Dosing Weight: ~84kg  Vital Signs: Temp: 98.2 F (36.8 C) (12/11 0000) Temp Source: Oral (12/11 0000) BP: 129/94 (12/11 0100) Pulse Rate: 112 (12/11 0100)  Labs:  Recent Labs  12/06/16 0950 12/06/16 1310 12/06/16 1805 12/07/16 0103  HGB 15.8 13.9  --  13.6  HCT 47.3 41.5  --  40.3  PLT 235 213  --  202  APTT 24  --   --   --   LABPROT 12.5  --   --   --   INR 0.93  --   --   --   HEPARINUNFRC  --   --   --  0.20*  CREATININE  --  1.09  --  1.10  TROPONINI 0.11* 30.68* 60.88* >65.00*    Estimated Creatinine Clearance: 80.1 mL/min (by C-G formula based on SCr of 1.1 mg/dL).  Assessment: 60 y.o. male with aFIB s/p STEMI/PCI for heparin  Goal of Therapy:  Heparin level 0.3-0.7 units/ml Monitor platelets by anticoagulation protocol: Yes   Plan:  Increase Heparin 1500 units/hr Check heparin level in 8 hours.  Basilia Stuckert, Gary FleetGregory Vernon 12/07/2016,1:59 AM

## 2016-12-07 NOTE — Progress Notes (Signed)
  Echocardiogram 2D Echocardiogram with definity has been performed.  Andrew Palmer, Andrew Palmer R 12/07/2016, 10:06 AM

## 2016-12-07 NOTE — Progress Notes (Signed)
ANTICOAGULATION CONSULT NOTE  Pharmacy Consult for Heparin Indication: atrial fibrillation  No Known Allergies  Patient Measurements: Height: 5\' 4"  (162.6 cm) Weight: 233 lb 4 oz (105.8 kg) IBW/kg (Calculated) : 59.2 Heparin Dosing Weight: ~84kg  Vital Signs: Temp: 98.2 F (36.8 C) (12/11 0813) Temp Source: Oral (12/11 0813) BP: 118/75 (12/11 1100) Pulse Rate: 95 (12/11 1100)  Labs:  Recent Labs  12/06/16 0950 12/06/16 1310 12/06/16 1805 12/07/16 0103 12/07/16 1035  HGB 15.8 13.9  --  13.6  --   HCT 47.3 41.5  --  40.3  --   PLT 235 213  --  202  --   APTT 24  --   --   --   --   LABPROT 12.5  --   --   --   --   INR 0.93  --   --   --   --   HEPARINUNFRC  --   --   --  0.20* 0.38  CREATININE  --  1.09  --  1.10  --   TROPONINI 0.11* 30.68* 60.88* >65.00*  --     Estimated Creatinine Clearance: 79.6 mL/min (by C-G formula based on SCr of 1.1 mg/dL).  Assessment: 60 y.o. male with aFIB s/p STEMI/PCI on heparin. Heparin level is at goal on 1500 units/hr.No further afib noted and plans noted to continue heparin for another 24hrs and follow for re-occurrence.  Goal of Therapy:  Heparin level 0.3-0.7 units/ml Monitor platelets by anticoagulation protocol: Yes   Plan:  -No heparin changes needed -Will confirm a level in 6 hrs -daily heparin level and CBC  Harland GermanAndrew Harvey Lingo, Pharm D 12/07/2016 11:40 AM

## 2016-12-07 NOTE — Progress Notes (Signed)
Patient Name: Andrew Palmer Date of Encounter: 12/07/2016  Primary Cardiologist: [place].   Patient Profile     60 y/o without any real PMH (2/2 not seeing a MD) presented with essentially sub-acute presentation of Inf STEMI s/p PCI to RCA. Also Noted to be in rate controlled Afib -> converted overnight to sinus rhythm  Subjective   CP Free overnight.  He responds with a big thumbs up In SR - (~10 pm)  Inpatient Medications    Scheduled Meds: . aspirin  81 mg Oral Daily  . atorvastatin  80 mg Oral q1800  . pantoprazole  40 mg Oral Daily  . sodium chloride flush  3 mL Intravenous Q12H  . sodium chloride flush  3 mL Intravenous Q12H  . ticagrelor  90 mg Oral BID   Continuous Infusions: . heparin 1,500 Units/hr (12/07/16 0202)   PRN Meds: sodium chloride, sodium chloride, acetaminophen, ALPRAZolam, nitroGLYCERIN, ondansetron (ZOFRAN) IV, sodium chloride flush, sodium chloride flush, zolpidem   Vital Signs    Vitals:   12/07/16 0300 12/07/16 0400 12/07/16 0600 12/07/16 0700  BP: 119/84 138/90 104/80 123/87  Pulse: (!) 105 (!) 101 97 96  Resp: 16 (!) 21 13 15   Temp:  98.4 F (36.9 C)    TempSrc:  Oral    SpO2: 95% 95% 96% 96%  Weight:  105.8 kg (233 lb 4 oz)    Height:        Intake/Output Summary (Last 24 hours) at 12/07/16 0747 Last data filed at 12/07/16 0600  Gross per 24 hour  Intake          1277.45 ml  Output             3725 ml  Net         -2447.55 ml   Filed Weights   12/06/16 1200 12/07/16 0400  Weight: 106.9 kg (235 lb 10.8 oz) 105.8 kg (233 lb 4 oz)    Physical Exam    GEN: Well nourished, well developed, in no acute distress.  HEENT: Grossly normal.  Neck: Supple, no JVD, carotid bruits, or masses. Cardiac: RRR, no murmurs, rubs, or gallops. No clubbing, cyanosis, edema.  Radials/DP/PT 2+ and equal bilaterally.  Respiratory:  Respirations regular and unlabored, clear to auscultation bilaterally. GI: Soft, nontender, nondistended,  BS + x 4. Skin: warm and dry, no rash. Neuro:  Strength and sensation are intact. Psych: AAOx3.  Normal affect.  Labs    CBC  Recent Labs  12/06/16 1310 12/07/16 0103  WBC 8.4 8.3  HGB 13.9 13.6  HCT 41.5 40.3  MCV 91.2 90.8  PLT 213 202   Basic Metabolic Panel  Recent Labs  12/06/16 1310 12/07/16 0103  NA 137 138  K 4.9 3.8  CL 108 107  CO2 19* 22  GLUCOSE 123* 118*  BUN 19 17  CREATININE 1.09 1.10  CALCIUM 8.8* 8.8*   Liver Function Tests  Recent Labs  12/07/16 0103  AST 218*  ALT 43  ALKPHOS 44  BILITOT 0.5  PROT 6.8  ALBUMIN 3.8   No results for input(s): LIPASE, AMYLASE in the last 72 hours. Cardiac Enzymes  Recent Labs  12/06/16 1310 12/06/16 1805 12/07/16 0103  TROPONINI 30.68* 60.88* >65.00*   BNP Invalid input(s): POCBNP D-Dimer No results for input(s): DDIMER in the last 72 hours. Hemoglobin A1C No results for input(s): HGBA1C in the last 72 hours. Fasting Lipid Panel  Recent Labs  12/07/16 0103  CHOL 193  HDL 38*  LDLCALC 106*  TRIG 246*  CHOLHDL 5.1   Thyroid Function Tests  Recent Labs  12/06/16 1310  TSH 1.463    Telemetry    NSR - 90s: Personally Reviewed  ECG    NSR - 99 bpm - Personally Reviewed, evolving Inf MI pattern with ~q Waves & biphasic ST-T  Radiology    No results found.  Cardiac Studies    CARDIAC CATH - PCI (12/10)  Prox LAD lesion, 30 %stenosed.  Mid LAD lesion, 30 %stenosed.  Mid RCA lesion, 100 %stenosed.  A STENT PROMUS PREM MR 3.5X24 drug eluting stent was successfully placed.  Post intervention, there is a 0% residual stenosis.   1. Single vessel occlusive CAD -- Successful PCI of the mid RCA with DES. 2. Good overall LV function --  Normal LVEDP 14 mm Hg  Plan: DAPT for one year. Risk factor modification. If Afib persists may need to consider long term anticoagulation. For now will resume IV heparin 8 hours post sheath removal  Echocardiogram being performed today. On  initial interview that appears to be basal inferior hypokinesis to akinesis but otherwise observed EF.   Hospital Problem List     Principal Problem:   ST elevation (STEMI) myocardial infarction involving right coronary artery [facility](HCC) Active Problems:   Atrial fibrillation (HCC)   CAD S/P percutaneous coronary angioplasty   HTN, goal below 130/80   Hyperlipidemia with target low density lipoprotein (LDL) cholesterol less than 70 mg/dL  Assessment & Plan    Principal Problem:   ST elevation (STEMI) myocardial infarction involving right coronary artery (HCC) - troponin level is quite high  DES PCI to RCA (suspect somewhat subacute presentation)   LV Fxn seems preserved on LV Gram - Echo pending  Active Problems:   Atrial fibrillation (HCC) - not sure if related to MI or chronic; now in NSR  Rate controlled on no meds: will start low dose BB  IV Heparin started o/n -> will continue for at least another 24 hours to ensure no recurrence of A. fib.  If recurs, will need to anticoagulate -- either warfarin with ASA/Brilinta or convert to Plavix & use DOAC (CM/Soc Work consult)    HTN, goal below 130/80  BP was initially low - should be able to start low dose BB    Hyperlipidemia with target low density lipoprotein (LDL) cholesterol less than 70 mg/dL  On high dose Atorvastatin  Cardiac rehabilitation consultation for ambulation. Anticipate that he can be moved to telemetry later today.     Signed, Andrew Lemmaavid Harding, MD  12/07/2016, 7:47 AM

## 2016-12-07 NOTE — Progress Notes (Signed)
ANTICOAGULATION CONSULT NOTE  Pharmacy Consult for Heparin Indication: atrial fibrillation  No Known Allergies  Patient Measurements: Height: 5\' 4"  (162.6 cm) Weight: 233 lb 4 oz (105.8 kg) IBW/kg (Calculated) : 59.2 Heparin Dosing Weight: ~84kg  Vital Signs: Temp: 98.5 F (36.9 C) (12/11 1622) Temp Source: Oral (12/11 1622) BP: 103/73 (12/11 1600) Pulse Rate: 73 (12/11 1600)  Labs:  Recent Labs  12/06/16 0950 12/06/16 1056 12/06/16 1310 12/06/16 1805 12/07/16 0103 12/07/16 1035 12/07/16 1544  HGB 15.8 13.9 13.9  --  13.6  --   --   HCT 47.3 41.0 41.5  --  40.3  --   --   PLT 235  --  213  --  202  --   --   APTT 24  --   --   --   --   --   --   LABPROT 12.5  --   --   --   --   --   --   INR 0.93  --   --   --   --   --   --   HEPARINUNFRC  --   --   --   --  0.20* 0.38 0.47  CREATININE  --  0.80 1.09  --  1.10  --   --   TROPONINI 0.11*  --  30.68* 60.88* >65.00*  --   --     Estimated Creatinine Clearance: 79.6 mL/min (by C-G formula based on SCr of 1.1 mg/dL).  Assessment: 60 y.o. male with Afib s/p STEMI/PCI to RCA on heparin. Heparin level 0.47 is at goal on 1500 units/hr.No further afib noted and plans noted to continue heparin for another 24hrs and follow for re-occurrence.    Goal of Therapy:  Heparin level 0.3-0.7 units/ml Monitor platelets by anticoagulation protocol: Yes   Plan:  Continue heparin drip 1500 uts/hr Daily heparin level and CBC  Leota SauersLisa Riley Hallum Pharm.D. CPP, BCPS Clinical Pharmacist [phone] 12/07/2016 5:39 PM

## 2016-12-07 NOTE — Care Management Note (Signed)
Case Management Note  Patient Details  Name: Andrew PrinceJuan Ricardo Phillips MRN: [id number] Date of Birth: 10-26-1956  Subjective/Objective:       Adm w mi             Action/Plan: lives in [place], does not speak english. Lives at home.   Expected Discharge Date:  12/09/16               Expected Discharge Plan:  Home/Self Care  In-House Referral:     Discharge planning Services  CM Consult, Medication Assistance, Indigent Health Clinic  Post Acute Care Choice:    Choice offered to:     DME Arranged:    DME Agency:     HH Arranged:    HH Agency:     Status of Service:  In process, will continue to follow  If discussed at Long Length of Stay Meetings, dates discussed:    Additional Comments:[place] inform [place]. Gave pt 30 day free brilinta card.left brilinta pt assist form on shadow chart.  Hanley Haysowell, Jamarkis Branam T, RN 12/07/2016, 9:31 AM

## 2016-12-08 LAB — CBC
HCT: 42 % (ref 39.0–52.0)
Hemoglobin: 13.5 g/dL (ref 13.0–17.0)
MCH: 30.1 pg (ref 26.0–34.0)
MCHC: 32.1 g/dL (ref 30.0–36.0)
MCV: 93.5 fL (ref 78.0–100.0)
Platelets: 197 10*3/uL (ref 150–400)
RBC: 4.49 MIL/uL (ref 4.22–5.81)
RDW: 14.1 % (ref 11.5–15.5)
WBC: 7.1 10*3/uL (ref 4.0–10.5)

## 2016-12-08 LAB — HEPARIN LEVEL (UNFRACTIONATED): HEPARIN UNFRACTIONATED: 0.78 [IU]/mL — AB (ref 0.30–0.70)

## 2016-12-08 MED ORDER — DOCUSATE SODIUM 100 MG PO CAPS
100.0000 mg | ORAL_CAPSULE | Freq: Two times a day (BID) | ORAL | Status: DC | PRN
  Administered 2016-12-08: 100 mg via ORAL
  Filled 2016-12-08: qty 1

## 2016-12-08 NOTE — Progress Notes (Signed)
CARDIAC REHAB PHASE I   PRE:  Rate/Rhythm: 70 SR    BP: sitting 124/60    SaO2:   MODE:  Ambulation: 490 ft   POST:  Rate/Rhythm: 82 SR    BP: sitting 130/70     SaO2:   Tolerated very well. Feels good. Will educate in the morning. He can walk independently. [phone]   Harriet MassonRandi Kristan Marvin Maenza CES, ACSM 12/08/2016 3:23 PM

## 2016-12-08 NOTE — Progress Notes (Signed)
ANTICOAGULATION CONSULT NOTE  Pharmacy Consult for Heparin Indication: atrial fibrillation  No Known Allergies  Patient Measurements: Height: 5\' 4"  (162.6 cm) Weight: 233 lb 4 oz (105.8 kg) IBW/kg (Calculated) : 59.2 Heparin Dosing Weight: ~84kg  Vital Signs: Temp: 98.5 F (36.9 C) (12/11 2129) Temp Source: Oral (12/11 2129) BP: 110/73 (12/11 2129) Pulse Rate: 71 (12/11 2129)  Labs:  Recent Labs  12/06/16 0950 12/06/16 1056 12/06/16 1310 12/06/16 1805  12/07/16 0103 12/07/16 1035 12/07/16 1544 12/08/16 0408  HGB 15.8 13.9 13.9  --   --  13.6  --   --  13.5  HCT 47.3 41.0 41.5  --   --  40.3  --   --  42.0  PLT 235  --  213  --   --  202  --   --  197  APTT 24  --   --   --   --   --   --   --   --   LABPROT 12.5  --   --   --   --   --   --   --   --   INR 0.93  --   --   --   --   --   --   --   --   HEPARINUNFRC  --   --   --   --   < > 0.20* 0.38 0.47 0.78*  CREATININE  --  0.80 1.09  --   --  1.10  --   --   --   TROPONINI 0.11*  --  30.68* 60.88*  --  >65.00*  --   --   --   < > = values in this interval not displayed.  Estimated Creatinine Clearance: 79.6 mL/min (by C-G formula based on SCr of 1.1 mg/dL).  Assessment: 60 y.o. male with Afib s/p STEMI/PCI to RCA on heparin.     Goal of Therapy:  Heparin level 0.3-0.7 units/ml Monitor platelets by anticoagulation protocol: Yes   Plan:  Decrease Heparin 1400 units/hr  Geannie RisenGreg Lamine Laton, PharmD, BCPS  12/08/2016 4:50 AM

## 2016-12-08 NOTE — Progress Notes (Signed)
Patient ambulated in the hall by self, well tolerated will continue to monitor

## 2016-12-08 NOTE — Progress Notes (Signed)
[facility]uan [facility] Date of Encounter: 12/08/2016  Primary Cardiologist: [place].   Patient Profile     60 y/o without any real PMH (2/2 not seeing a MD) presented with essentially sub-acute presentation of Inf STEMI s/p PCI to RCA. Also Noted to be in rate controlled Afib -> converted overnight to sinus rhythm  Subjective   CP Free overnight.  He responds with a big thumbs up -- only complaint is constipation In SR   Inpatient Medications    Scheduled Meds: . aspirin  81 mg Oral Daily  . atorvastatin  80 mg Oral q1800  . metoprolol tartrate  25 mg Oral BID  . pantoprazole  40 mg Oral Daily  . sodium chloride flush  3 mL Intravenous Q12H  . ticagrelor  90 mg Oral BID   Continuous Infusions:  PRN Meds: sodium chloride, acetaminophen, ALPRAZolam, nitroGLYCERIN, ondansetron (ZOFRAN) IV, sodium chloride flush, zolpidem   Vital Signs    Vitals:   12/08/16 0531 12/08/16 0534 12/08/16 0858 12/08/16 1216  BP:  106/72 138/79 115/74  Pulse:  64 74 77  Resp:  20 18 19   Temp:  98.4 F (36.9 C) 98.6 F (37 C) 98.7 F (37.1 C)  TempSrc:  Oral Oral Oral  SpO2:  99% 100% 99%  Weight: 104.2 kg (229 lb 12.8 oz)     Height:        Intake/Output Summary (Last 24 hours) at 12/08/16 1536 Last data filed at 12/08/16 1217  Gross per 24 hour  Intake              910 ml  Output                0 ml  Net              910 ml   Filed Weights   12/06/16 1200 12/07/16 0400 12/08/16 0531  Weight: 106.9 kg (235 lb 10.8 oz) 105.8 kg (233 lb 4 oz) 104.2 kg (229 lb 12.8 oz)    Physical Exam    GEN: Well nourished, well developed, in no acute distress.  HEENT: Grossly normal.  Neck: Supple, no JVD, carotid bruits, or masses. Cardiac: RRR, no murmurs, rubs, or gallops. No clubbing, cyanosis, edema.  Radials/DP/PT 2+ and equal bilaterally.  Respiratory:  Respirations regular and unlabored, clear to auscultation bilaterally. GI: Soft, nontender, nondistended, BS + x  4. Skin: warm and dry, no rash. Neuro:  Strength and sensation are intact. Psych: AAOx3.  Normal affect.  Labs    CBC  Recent Labs  12/07/16 0103 12/08/16 0408  WBC 8.3 7.1  HGB 13.6 13.5  HCT 40.3 42.0  MCV 90.8 93.5  PLT 202 197   Basic Metabolic Panel  Recent Labs  12/06/16 1310 12/07/16 0103  NA 137 138  K 4.9 3.8  CL 108 107  CO2 19* 22  GLUCOSE 123* 118*  BUN 19 17  CREATININE 1.09 1.10  CALCIUM 8.8* 8.8*   Liver Function Tests  Recent Labs  12/07/16 0103  AST 218*  ALT 43  ALKPHOS 44  BILITOT 0.5  PROT 6.8  ALBUMIN 3.8   No results for input(s): LIPASE, AMYLASE in the last 72 hours. Cardiac Enzymes  Recent Labs  12/06/16 1310 12/06/16 1805 12/07/16 0103  TROPONINI 30.68* 60.88* >65.00*   BNP Invalid input(s): POCBNP D-Dimer No results for input(s): DDIMER in the last 72 hours. Hemoglobin A1C  Recent Labs  12/06/16 1310  HGBA1C 6.4*   Fasting  Lipid Panel  Recent Labs  12/07/16 0103  CHOL 193  HDL 38*  LDLCALC 106*  TRIG 246*  CHOLHDL 5.1   Thyroid Function Tests  Recent Labs  12/06/16 1310  TSH 1.463    Telemetry    NSR - 90s: Personally Reviewed  ECG    NSR - 99 bpm - Personally Reviewed, evolving Inf MI pattern with ~q Waves & biphasic ST-T  Radiology    No results found.  Cardiac Studies    CARDIAC CATH - PCI (12/10)  Prox LAD lesion, 30 %stenosed.  Mid LAD lesion, 30 %stenosed.  Mid RCA lesion, 100 %stenosed.  A STENT PROMUS PREM MR 3.5X24 drug eluting stent was successfully placed.  Post intervention, there is a 0% residual stenosis.   1. Single vessel occlusive CAD -- Successful PCI of the mid RCA with DES. 2. Good overall LV function --  Normal LVEDP 14 mm Hg  Plan: DAPT for one year. Risk factor modification. If Afib persists may need to consider long term anticoagulation. For now will resume IV heparin 8 hours post sheath removal  Echocardiogram 12/11:  Mod LVH. EF ~50-55%, inferior HK.  Gr 1 DD. Otherwise normal.  Hospital Problem List     Principal Problem:   ST elevation (STEMI) myocardial infarction involving right coronary artery (HCC) Active Problems:   Atrial fibrillation (HCC)   CAD S/P percutaneous coronary angioplasty   HTN, goal below 130/80   Hyperlipidemia with target low density lipoprotein (LDL) cholesterol less than 70 mg/dL  Assessment & Plan    Principal Problem:   ST elevation (STEMI) myocardial infarction involving right coronary artery (HCC) - troponin level is quite high  DES PCI to RCA (suspect somewhat subacute presentation)   LV Fxn seems preserved on LV Gram - Echo confirms findings.  Active Problems:   Atrial fibrillation (HCC) - not sure if related to MI or chronic; now in NSR - no recurrence - if no Afib overnight tonite, will plan to d/c with home 30 d monitor & no AC.  Rate controlled on no meds: will start low dose BB  IV Heparin started o/n -> d/c today  If recurs, will need to anticoagulate -- either warfarin with ASA/Brilinta or convert to Plavix & use DOAC (CM/Soc Work consult)    HTN, goal below 130/80  BP was initially low - should be able to start low dose BB    Hyperlipidemia with target low density lipoprotein (LDL) cholesterol less than 70 mg/dL  On high dose Atorvastatin  Cardiac rehabilitation consultation for ambulation. Anticipate that he can d/c home in AM -- would like to monitor for recurrent Afib x 1 more night prior to determine +/- full AC.   Constipation - increase bowel regimen -he needs a PCP b/c he has chronic Shoulder & Knee pain - takes his friends Tramadol   Need CM/SW consult for med assistance & to help with financial concerns.  Very worried about about being out of worl.  Signed, Bryan Lemmaavid Harding, MD  12/08/2016, 3:36 PM

## 2016-12-09 ENCOUNTER — Encounter (HOSPITAL_COMMUNITY): Payer: Self-pay | Admitting: Nurse Practitioner

## 2016-12-09 LAB — CBC
HCT: 42.1 % (ref 39.0–52.0)
Hemoglobin: 13.7 g/dL (ref 13.0–17.0)
MCH: 30 pg (ref 26.0–34.0)
MCHC: 32.5 g/dL (ref 30.0–36.0)
MCV: 92.3 fL (ref 78.0–100.0)
PLATELETS: 223 10*3/uL (ref 150–400)
RBC: 4.56 MIL/uL (ref 4.22–5.81)
RDW: 13.8 % (ref 11.5–15.5)
WBC: 6.6 10*3/uL (ref 4.0–10.5)

## 2016-12-09 MED ORDER — METOPROLOL TARTRATE 25 MG PO TABS: 25.0000 mg | ORAL_TABLET | Freq: Two times a day (BID) | ORAL | 12 refills | Status: AC

## 2016-12-09 MED ORDER — PANTOPRAZOLE SODIUM 40 MG PO TBEC: 40.0000 mg | DELAYED_RELEASE_TABLET | Freq: Every day | ORAL | 12 refills | Status: DC

## 2016-12-09 MED ORDER — INFLUENZA VAC SPLIT QUAD 0.5 ML IM SUSY: 0.5000 mL | PREFILLED_SYRINGE | INTRAMUSCULAR | Status: DC

## 2016-12-09 MED ORDER — INFLUENZA VAC SPLIT QUAD 0.5 ML IM SUSY
0.5000 mL | PREFILLED_SYRINGE | Freq: Once | INTRAMUSCULAR | Status: AC
  Administered 2016-12-09: 0.5 mL via INTRAMUSCULAR

## 2016-12-09 MED ORDER — NITROGLYCERIN 0.4 MG SL SUBL: 0.4000 mg | SUBLINGUAL_TABLET | SUBLINGUAL | 2 refills | Status: DC | PRN

## 2016-12-09 MED ORDER — ASPIRIN 81 MG PO CHEW: 81.0000 mg | CHEWABLE_TABLET | Freq: Every day | ORAL | Status: DC

## 2016-12-09 MED ORDER — ATORVASTATIN CALCIUM 80 MG PO TABS
80.0000 mg | ORAL_TABLET | Freq: Every day | ORAL | 12 refills | Status: DC
Start: 2016-12-09 — End: 2016-12-14

## 2016-12-09 MED ORDER — TICAGRELOR 90 MG PO TABS: 90.0000 mg | ORAL_TABLET | Freq: Two times a day (BID) | ORAL | 12 refills | Status: DC

## 2016-12-09 NOTE — Progress Notes (Signed)
Ed completed through interpreter, Ashby DawesGraciela. Pt voiced understanding. His main concern is getting Brilinta and other meds, which CM will come and explain more later. He understands importance of Brilinta. Encouraged him to decrease sugar/carbs to avoid DM. Will refer to [place] [facility].  [phone] Ethelda ChickKristan Mandolin Falwell CES, ACSM 11:08 AM 12/09/2016

## 2016-12-09 NOTE — Progress Notes (Signed)
Interpreter Andrew Palmer for RN [facility] discharge instructions

## 2016-12-09 NOTE — Discharge Summary (Signed)
Discharge Summary    Patient ID: Andrew Palmer,  MRN: [id number], DOB/AGE: 1956/05/27 60 y.o.  Admit date: 12/06/2016 Discharge date: 12/09/2016  Primary Care Provider: No primary care provider on file. Primary Cardiologist:   Discharge Diagnoses    Principal Problem:   ST elevation (STEMI) myocardial infarction involving right coronary artery (HCC) Active Problems:   HTN, goal below 130/80   Atrial fibrillation (HCC)   CAD S/P percutaneous coronary angioplasty   Hyperlipidemia with target low density lipoprotein (LDL) cholesterol less than 70 mg/dL   Allergies No Known Allergies  Diagnostic Studies/Procedures    Coronary Stent Intervention  Left Heart Cath and Coronary Angiography   Prox LAD lesion, 30 %stenosed.  Mid LAD lesion, 30 %stenosed.  Mid RCA lesion, 100 %stenosed.  A STENT PROMUS PREM MR 3.5X24 drug eluting stent was successfully placed.  Post intervention, there is a 0% residual stenosis.   1. Single vessel occlusive CAD 2. Good overall LV function 3. Normal LVEDP 14 mm Hg 4. Successful PCI of the mid RCA with DES.  Plan: DAPT for one year. Risk factor modification. If Afib persists may need to consider long term anticoagulation. For now will resume IV heparin 8 hours post sheath removal.   Transthoracic Echocardiography 12/07/16 Study Conclusions  - Left ventricle: The cavity size was normal. Wall thickness was   increased in a pattern of moderate LVH. Systolic function was   normal. The estimated ejection fraction was in the range of 50%   to 55%. Inferior hypokinesis. Doppler parameters are consistent   with abnormal left ventricular relaxation (grade 1 diastolic   dysfunction). The E/e&' ratio is <8, suggesting normal LV filling   pressure. - Mitral valve: Calcified annulus. Mildly thickened leaflets . - Left atrium: The atrium was normal in size. - Right ventricle: The cavity size was mildly dilated. Systolic   function was  normal. - Right atrium: The atrium was normal in size. - Inferior vena cava: The vessel was normal in size. The   respirophasic diameter changes were in the normal range (>= 50%),   consistent with normal central venous pressure.  Impressions:  - Technically difficult study. Definity contrast given. LVEF   50-55%, moderate LVH, inferior hypokinesis, diastolic dysfunction   with normal LV filling pressure, normal biatrial size, mildly   dilated RV with normal systolic function, normal IVC.   _____________   History of Present Illness     HPI: Andrew Palmer is a spanish speaking 60 y.o. male with no previous cardiac history or evaluation. He has not had a checkup in > 20 years. Never been told he has HTN, DM, HLD.    He developed chest pain on the night on 12/05/16. When symptoms did not resolve overnight and the pain kept getting worse, he went to  ER on 12/06/16. His ECG was consistent with inferior STEMI, pt [facility] emerg[facility] and taken to the cath lab.  Hospital Course  Mr. Andrew Palmer a 100% occlusion to his mid RCA. A DES was successfully placed. His troponin peaked at >65. He was started on dual antiplatelet therapy with ASA and Brilinta.   He has some intermittent Afib, but converted spontaneously to NSR. We will arrange for a 30 day monitor at discharge.   He is on 25mg  metoprolol and a high intensity statin. He ambulated with cardiac rehab and did well. His post MI education was completed with the help of the interpretor.   He will follow up  with our office in [place]. He was seen today by Dr. Herbie BaltimoreHarding and deemed suitable for discharge.   _____________  Discharge Vitals Blood pressure 94/60, pulse 73, temperature 98.2 F (36.8 C), temperature source Oral, resp. rate 19, height 5\' 4"  (1.626 m), weight 229 lb 4.8 oz (104 kg), SpO2 97 %.  Filed Weights   12/07/16 0400 12/08/16 0531 12/09/16 0544  Weight: 233 lb 4 oz (105.8 kg) 229 lb 12.8 oz  (104.2 kg) 229 lb 4.8 oz (104 kg)    Labs & Radiologic Studies     CBC  Recent Labs  12/08/16 0408 12/09/16 0249  WBC 7.1 6.6  HGB 13.5 13.7  HCT 42.0 42.1  MCV 93.5 92.3  PLT 197 223   Basic Metabolic Panel  Recent Labs  12/06/16 1310 12/07/16 0103  NA 137 138  K 4.9 3.8  CL 108 107  CO2 19* 22  GLUCOSE 123* 118*  BUN 19 17  CREATININE 1.09 1.10  CALCIUM 8.8* 8.8*   Liver Function Tests  Recent Labs  12/07/16 0103  AST 218*  ALT 43  ALKPHOS 44  BILITOT 0.5  PROT 6.8  ALBUMIN 3.8   Cardiac Enzymes  Recent Labs  12/06/16 1310 12/06/16 1805 12/07/16 0103  TROPONINI 30.68* 60.88* >65.00*   Hemoglobin A1C  Recent Labs  12/06/16 1310  HGBA1C 6.4*   Fasting Lipid Panel  Recent Labs  12/07/16 0103  CHOL 193  HDL 38*  LDLCALC 106*  TRIG 246*  CHOLHDL 5.1   Thyroid Function Tests  Recent Labs  12/06/16 1310  TSH 1.463    No results found.  Disposition   Pt is being discharged home today in good condition.  Follow-up Plans & Appointments     Discharge Instructions    AMB Referral to Cardiac Rehabilitation - Phase II    Complete by:  As directed    Diagnosis:  STEMI      Discharge Medications   Current Discharge Medication List    CONTINUE these medications which have NOT CHANGED   Details  acetaminophen (TYLENOL) 500 MG tablet Take 1,000 mg by mouth every 6 (six) hours as needed for headache (pain).    ibuprofen (ADVIL,MOTRIN) 200 MG tablet Take 400 mg by mouth every 6 (six) hours as needed for headache (pain).         Aspirin prescribed at discharge?  Yes High Intensity Statin Prescribed? (Lipitor 40-80mg  or Crestor 20-40mg ): Yes Beta Blocker Prescribed? Yes For EF 40% or less, Was ACEI/ARB Prescribed? No: EF normal  ADP Receptor Inhibitor Prescribed? (i.e. Plavix etc.-Includes Medically Managed Patients): Yes For EF <40%, Aldosterone Inhibitor Prescribed? No: EF normal  Was EF assessed during THIS  hospitalization? Yes Was Cardiac Rehab II ordered? (Included Medically managed Patients): Yes   Outstanding Labs/Studies   Consider 30 day monitor.   Duration of Discharge Encounter   Greater than 30 minutes including physician time.  Signed, Little IshikawaErin E Smith NP 12/09/2016, 10:33 AM   I Micki RileyWould've the Patient This Morning on Rounds. Please See Final Progress Note for Details. He Was Doing Well with No Active Angina Symptoms. No Further A. fib. At This Point I Think He Is Probably Okay to Discharge without Endocrine Relation, but Would Think about Outpatient Event Monitor to Look for Arrhythmia. Will Defer to the Outpatient Cardiologist Following Him. [place].  For now continue with aspirin and Brilinta. Would be acceptable convert to Plavix if after the first month of free  Brilinta, he cannot afford it. A work note was provided, he should not return to work until seen in follow-up.  A Spanish interpreter was used to go through his discharge recommendations and education.   Bryan Lemma, M.D., M.S. Interventional Cardiologist   Pager # [phone] Phone # [phone] [address] [place], [place] [address]

## 2016-12-09 NOTE — Progress Notes (Signed)
[facility] Date of Encounter: 12/09/2016  Primary Cardiologist: [place].   Patient Profile     60 y/o without any real PMH (2/2 not seeing a MD) presented with essentially sub-acute presentation of Inf STEMI s/p PCI to RCA. Also Noted to be in rate controlled Afib -> converted overnight to sinus rhythm  Subjective   Remains chest pain-free. Had a large bowel movement and is very happy. No arrhythmias overnight.  Inpatient Medications    Scheduled Meds: . aspirin  81 mg Oral Daily  . atorvastatin  80 mg Oral q1800  . metoprolol tartrate  25 mg Oral BID  . pantoprazole  40 mg Oral Daily  . sodium chloride flush  3 mL Intravenous Q12H  . ticagrelor  90 mg Oral BID   Continuous Infusions:  PRN Meds: sodium chloride, acetaminophen, ALPRAZolam, docusate sodium, nitroGLYCERIN, ondansetron (ZOFRAN) IV, sodium chloride flush, zolpidem   Vital Signs    Vitals:   12/08/16 1216 12/08/16 2010 12/09/16 0544 12/09/16 1115  BP: 115/74 118/63 94/60 125/86  Pulse: 77 76 73 81  Resp: 19 18 19 18   Temp: 98.7 F (37.1 C) 98.3 F (36.8 C) 98.2 F (36.8 C) 98.3 F (36.8 C)  TempSrc: Oral Oral Oral Oral  SpO2: 99% 98% 97% 98%  Weight:   104 kg (229 lb 4.8 oz)   Height:        Intake/Output Summary (Last 24 hours) at 12/09/16 1227 Last data filed at 12/08/16 1839  Gross per 24 hour  Intake              360 ml  Output                0 ml  Net              360 ml   Filed Weights   12/07/16 0400 12/08/16 0531 12/09/16 0544  Weight: 105.8 kg (233 lb 4 oz) 104.2 kg (229 lb 12.8 oz) 104 kg (229 lb 4.8 oz)    Physical Exam    GEN: Well nourished, well developed, in no acute distress.  HEENT: Grossly normal.  Cardiac: RRR, no murmurs, rubs, or gallops. No clubbing, cyanosis, edema.  Radials/DP/PT 2+ and equal bilaterally.  Respiratory:  Respirations regular and unlabored, clear to auscultation bilaterally. GI: Soft, nontender, nondistended, BS + x  4. Neuro:  Strength and sensation are intact. Psych: AAOx3.  Normal affect.  Labs    CBC  Recent Labs  12/08/16 0408 12/09/16 0249  WBC 7.1 6.6  HGB 13.5 13.7  HCT 42.0 42.1  MCV 93.5 92.3  PLT 197 223   Basic Metabolic Panel  Recent Labs  12/06/16 1310 12/07/16 0103  NA 137 138  K 4.9 3.8  CL 108 107  CO2 19* 22  GLUCOSE 123* 118*  BUN 19 17  CREATININE 1.09 1.10  CALCIUM 8.8* 8.8*   Liver Function Tests  Recent Labs  12/07/16 0103  AST 218*  ALT 43  ALKPHOS 44  BILITOT 0.5  PROT 6.8  ALBUMIN 3.8   No results for input(s): LIPASE, AMYLASE in the last 72 hours. Cardiac Enzymes  Recent Labs  12/06/16 1310 12/06/16 1805 12/07/16 0103  TROPONINI 30.68* 60.88* >65.00*   BNP Invalid input(s): POCBNP D-Dimer No results for input(s): DDIMER in the last 72 hours. Hemoglobin A1C  Recent Labs  12/06/16 1310  HGBA1C 6.4*   Fasting Lipid Panel  Recent Labs  12/07/16 0103  CHOL 193  HDL  38*  LDLCALC 106*  TRIG 246*  CHOLHDL 5.1   Thyroid Function Tests  Recent Labs  12/06/16 1310  TSH 1.463    Telemetry    NSR - 70s to 80s: Personally Reviewed  ECG    No EKG checked today  Radiology    No results found.  Cardiac Studies    CARDIAC CATH - PCI (12/10)  Prox LAD lesion, 30 %stenosed.  Mid LAD lesion, 30 %stenosed.  Mid RCA lesion, 100 %stenosed.  A STENT PROMUS PREM MR 3.5X24 drug eluting stent was successfully placed.  Post intervention, there is a 0% residual stenosis.   1. Single vessel occlusive CAD -- Successful PCI of the mid RCA with DES. 2. Good overall LV function --  Normal LVEDP 14 mm Hg  Plan: DAPT for one year. Risk factor modification. If Afib persists may need to consider long term anticoagulation. For now will resume IV heparin 8 hours post sheath removal  Echocardiogram 12/11:  Mod LVH. EF ~50-55%, inferior HK. Gr 1 DD. Otherwise normal.  Hospital Problem List     Principal Problem:   ST  elevation (STEMI) myocardial infarction involving right coronary artery (HCC) Active Problems:   Atrial fibrillation (HCC)   CAD S/P percutaneous coronary angioplasty   HTN, goal below 130/80   Hyperlipidemia with target low density lipoprotein (LDL) cholesterol less than 70 mg/dL  Assessment & Plan    Principal Problem:   ST elevation (STEMI) myocardial infarction involving right coronary artery (HCC) - troponin level is quite high  DES PCI to RCA (suspect somewhat subacute presentation) - no further angina.  LV Fxn seems preserved on LV Gram - Echo confirms findings.  PCI to RCA with resolution of symptoms.  With STEMI, as far as returning to work, would recommend waiting until he is seen in outpatient follow-up for reassessment. Probably after 3 weeks can return to light duty. After 4-6 weeks full duty.  Active Problems:   Atrial fibrillation (HCC) - no further A. fib. Likely viral related to his MI. At this time I think for safekeeping him on Brilinta and aspirin. --> Would consider outpatient monitor  Rate controlled on no meds: will start low dose BB  IV Heparin started o/n -> d/c today  If recurs, will need to anticoagulate -- either warfarin with ASA/Brilinta or convert to Plavix & use DOAC (CM/Soc Work consult)    HTN, goal below 130/80  BP was initially low but tolerating low dose BB    Hyperlipidemia with target low density lipoprotein (LDL) cholesterol less than 70 mg/dL  On high dose Atorvastatin  Cardiac rehabilitation consultation for ambulation.   Plan: d/c home in today-- Spanish interpreter is coming for educational purposes and discharge instructions   -he needs a PCP b/c he has chronic Shoulder & Knee pain - takes his friends Tramadol  Need CM/SW consult for med assistance & to help with financial concerns.  Very worried about about being out of worl.  Signed, Bryan Lemmaavid Mersadie Kavanaugh, MD  12/09/2016, 12:27 PM

## 2016-12-09 NOTE — Progress Notes (Addendum)
Discharge instructions and education completed with patient and family using an hospital interpreter via telephone, they verbalised understanding, documents from case manager given to patient, tele dc ccmd notified, iv removed, patient belongings at bedside. Patient refused wheelchair and decided to ambulate off the unit

## 2016-12-09 NOTE — Progress Notes (Signed)
Interpreter Wyvonnia DuskyGraciela Namihira for [facility] Teaching

## 2016-12-09 NOTE — Care Management Note (Signed)
Case Management Note Donn PieriniKristi Whitaker Holderman RN, BSN Unit 2W-Case Manager [phone]  Patient Details  Name: Adrian PrinceJuan Ricardo Sulser MRN: [id number] Date of Birth: 10/28/1956  Subjective/Objective:  Pt admitted with STEMI s/p PCI to RCA               Action/Plan: PTA pt lived at home- plan to return home- spoke with pt at bedside with interpreter - discussed clinics in [place] for PCP needs- pt given list of clinics that see self pay patients- pt also started on Brilinta- pt given card to use for 30 day free- pt also given pt assistance application will f/u with cardiology. [facility]- letter given to pt with copay $3 per script- list of pharmacies also provided- all above discussed through interpreter.   Expected Discharge Date:  12/09/16               Expected Discharge Plan:  Home/Self Care  In-House Referral:     Discharge planning Services  CM Consult, Medication Assistance, Indigent Health Clinic, [facility]  Post Acute Care Choice:    Choice offered to:     DME Arranged:    DME Agency:     HH Arranged:    HH Agency:     Status of Service:  Completed, signed off  If discussed at [employer] of [employer] Meetings, dates discussed:    Additional Comments:  Darrold SpanWebster, Ricke Kimoto Hall, RN 12/09/2016, 2:39 PM

## 2016-12-14 ENCOUNTER — Encounter: Payer: Self-pay | Admitting: Nurse Practitioner

## 2016-12-14 ENCOUNTER — Ambulatory Visit (INDEPENDENT_AMBULATORY_CARE_PROVIDER_SITE_OTHER): Payer: Self-pay | Admitting: Nurse Practitioner

## 2016-12-14 VITALS — BP 122/82 | HR 61 | Ht 68.0 in | Wt 228.0 lb

## 2016-12-14 DIAGNOSIS — Z9861 Coronary angioplasty status: Secondary | ICD-10-CM

## 2016-12-14 DIAGNOSIS — I2119 ST elevation (STEMI) myocardial infarction involving other coronary artery of inferior wall: Secondary | ICD-10-CM

## 2016-12-14 DIAGNOSIS — I10 Essential (primary) hypertension: Secondary | ICD-10-CM

## 2016-12-14 DIAGNOSIS — I2510 Atherosclerotic heart disease of native coronary artery without angina pectoris: Secondary | ICD-10-CM

## 2016-12-14 DIAGNOSIS — E782 Mixed hyperlipidemia: Secondary | ICD-10-CM

## 2016-12-14 DIAGNOSIS — I480 Paroxysmal atrial fibrillation: Secondary | ICD-10-CM

## 2016-12-14 MED ORDER — CLOPIDOGREL BISULFATE 75 MG PO TABS: 75.0000 mg | ORAL_TABLET | Freq: Every day | ORAL | 6 refills | Status: DC

## 2016-12-14 MED ORDER — ATORVASTATIN CALCIUM 80 MG PO TABS: 80.0000 mg | ORAL_TABLET | Freq: Every day | ORAL | 3 refills | Status: AC

## 2016-12-14 NOTE — Progress Notes (Signed)
Office Visit    Patient Name: Andrew Palmer Date of Encounter: 12/14/2016  Primary Care Provider:  No primary care provider on file. Primary Cardiologist:  Pt will f/u with Dr. Okey DupreEnd  Chief Complaint    60 y/o ? s/p recent inferior STEMI, who presents for f/u.  Past Medical History    Past Medical History:  Diagnosis Date  . CAD s/p Inferior STEMI involving the RCA. 12/06/2016   a. 11/2016 Inf STEMI/PCI: LM nl, LAD 30p/m, RCA 100 (3.5 x 24 Promus Premier DES).  . Chronic pain   . Diastolic dysfunction    a. 11/2016 Echo: EF 50-55%, inf HK, grade 1 DD, nl LA size, mildly dil RV w/ nl fxn.  . HTN, goal below 130/80 12/06/2016  . Hyperlipidemia   . PAF (paroxysmal atrial fibrillation) (HCC)    a. 11/2016 - noted @ time of inferior STEMI.   Past Surgical History:  Procedure Laterality Date  . CARDIAC CATHETERIZATION N/A 12/06/2016   Procedure: Left Heart Cath and Coronary Angiography;  Surgeon: Peter M SwazilandJordan, MD;  [facility];  Service: Cardiovascular;  Laterality: N/A;  . CARDIAC CATHETERIZATION N/A 12/06/2016   Procedure: Coronary Stent Intervention;  Surgeon: Peter M SwazilandJordan, MD;  [facility];  Service: Cardiovascular;  Laterality: N/A;    Allergies  No Known Allergies  History of Present Illness    60 year old male without significant prior medical history, though he had not followed up with a provider in over 20 years. He previously used alcohol and drugs but quit many years ago. [facility] 10 secondary to progressive chest pain. There, he was found to have inferior ST segment elevation and he was [facility]. Catheterization revealed an occluded mid right coronary artery with otherwise nonobstructive disease. The right coronary artery was successfully intervened upon with a drug-eluting stent. He was noted to be in rate controlled atrial fibrillation on  admission but subsequently converted by the next morning. He had no further A. fib. He was placed on aspirin, beta blocker, high potency statin, and Brilinta therapy. He was subsequently discharged in good condition. Recommendation was made for outpatient monitoring, however the patient is uninsured and cannot afford this.  Since his discharge, he has done quite well. He denies chest pain, dyspnea, PND, orthopnea, dizziness, syncope, edema, or early satiety. He has been compliant with his medications though notes that he does not have Lipitor. He doesn't know why. His sister is here with him today as is an interpreter. He had to pay for Brilinta out-of-pocket and doesn't think he'll be able to do this going forward. He has a 30 day free card but the [employer] [place] told him they would not accept for some reason. He is interested in switching to Plavix after he completes this 30 day course of Brilinta.  Home Medications    Prior to Admission medications   Medication Sig Start Date End Date Taking? Authorizing Provider  acetaminophen (TYLENOL) 500 MG tablet Take 1,000 mg by mouth every 6 (six) hours as needed for headache (pain).   Yes Historical Provider, MD  aspirin 81 MG chewable tablet Chew 1 tablet (81 mg total) by mouth daily. 12/10/16  Yes Little IshikawaErin E Smith, NP  metoprolol tartrate (LOPRESSOR) 25 MG tablet Take 1 tablet (25 mg total) by mouth 2 (two) times daily. 12/09/16  Yes Little IshikawaErin E Smith, NP  nitroGLYCERIN (NITROSTAT) 0.4 MG SL  tablet Place 1 tablet (0.4 mg total) under the tongue every 5 (five) minutes x 3 doses as needed for chest pain. 12/09/16  Yes Little IshikawaErin E Smith, NP  pantoprazole (PROTONIX) 40 MG tablet Take 1 tablet (40 mg total) by mouth daily. 12/10/16  Yes Little IshikawaErin E Smith, NP  ticagrelor (BRILINTA) 90 MG TABS tablet Take 1 tablet (90 mg total) by mouth 2 (two) times daily. 12/09/16  Yes Little IshikawaErin E Smith, NP    Review of Systems    As above, doing well since discharge.  He denies chest pain,  palpitations, dyspnea, pnd, orthopnea, n, v, dizziness, syncope, edema, weight gain, or early satiety.  All other systems reviewed and are otherwise negative except as noted above.  Physical Exam    VS:  BP 122/82 (BP Location: Left Arm, Patient Position: Sitting, Cuff Size: Normal)   Pulse 61   Ht 5\' 8"  (1.727 m)   Wt 228 lb (103.4 kg)   BMI 34.67 kg/m  , BMI Body mass index is 34.67 kg/m. GEN: Well nourished, well developed, in no acute distress.  HEENT: normal.  Neck: Supple, no JVD, carotid bruits, or masses. Cardiac: RRR, no murmurs, rubs, or gallops. No clubbing, cyanosis, edema.  Radials/DP/PT 2+ and equal bilaterally. Right wrist catheterization site without bleeding, bruit, or hematoma. Respiratory:  Respirations regular and unlabored, clear to auscultation bilaterally. GI: Soft, nontender, nondistended, BS + x 4. MS: no deformity or atrophy. Skin: warm and dry, no rash. Neuro:  Strength and sensation are intact. Psych: Normal affect.  Accessory Clinical Findings    ECG - Regular sinus rhythm, 61, nonspecific T changes.  Assessment & Plan    1.  Inferior ST segment elevation myocardial infarction, subsequent episode of care/coronary artery disease: Patient has been doing well since discharge without chest pain or dyspnea. He notes a dramatic difference in his exercise tolerance since PCI. He has been compliant with his medications though notes that he did not fill his Lipitor but isn't sure as to why. I will send in another prescription for Lipitor 80 mg daily. He remains on beta blocker and aspirin and has made it clear that he will have trouble affording Brilinta going forward. He wishes to come off of it after 30 days in favor of a generic. He does not want to have to fill [place] paperwork for the [place] program.  After tonight's dose of Brilinta, he has enough for 24 more days. Therefore, once he finishes this, he will start Plavix 75 mg daily. He currently has medicaid  coverage pending.  2.  PAF:  Noted to be in AFib on admission in setting of inferior STEMI.  He converted the following morning and did not have any further afib during hospitalization.  Recommendation for outpt monitoring was made but not set up.  I discussed this with [name] through the interpreter today.  He does not have insurance and would have to pay $350 up front for monitoring.  He does not have this and wishes to defer monitoring for now.  He will contact us or present to the ED if notes irregularity or tachycardia.  Cont  blocker, asa.  3.  HL:  LDL 106 during hospitalization.  He did not fill his Lipitor prescription. I have reordered.  4. Borderline diabetes: Hemoglobin A1c was 6.4 during hospital position. He will need primary care follow-up.  5. Essential hypertension: Blood pressure stable today on beta blocker therapy.  6. Disposition: Follow-up in approximately 4 weeks  to ensure that he has successfully converted to Plavix. Plan to follow-up lipids and LFT shortly thereafter.   Nicolasa Ducking, NP 12/14/2016, 3:26 PM

## 2016-12-14 NOTE — Patient Instructions (Signed)
Medication Instructions:  Your physician has recommended you make the following change in your medication:  1. On the day after you complete your Eliquis take 4 tablets of Plavix then one tablet daily 2. Lipitor (Atorvastatin) 80 mg Once daily   Follow-Up: Your physician recommends that you schedule a follow-up appointment the week of the 15th.   It was a pleasure seeing you today here in the office. Please do not hesitate to give us a call back if you have any further questions. [phone]  Camas CellarPamela A. RN, BSN

## 2017-01-12 ENCOUNTER — Ambulatory Visit: Payer: Self-pay | Admitting: Internal Medicine

## 2017-01-19 ENCOUNTER — Encounter: Payer: Self-pay | Admitting: *Deleted

## 2017-01-19 ENCOUNTER — Ambulatory Visit: Payer: Self-pay | Admitting: Internal Medicine

## 2017-01-19 NOTE — Progress Notes (Deleted)
Follow-up Outpatient Visit Date: 01/19/2017  Primary Care Provider: No primary care provider on file. No primary provider on file.  Chief Complaint: ***  HPI:  Andrew Palmer is a 61 y.o. year-old male with history of ***, who presents for follow-up of ***.  --------------------------------------------------------------------------------------------------  Cardiovascular History & Procedures: Cardiovascular Problems:  ***  Risk Factors:  ***  Cath/PCI:  ***  CV Surgery:  ***  EP Procedures and Devices:  ***  Non-Invasive Evaluation(s):  ***  Recent CV Pertinent Labs: Lab Results  Component Value Date   CHOL 193 12/07/2016   HDL 38 (L) 12/07/2016   LDLCALC 106 (H) 12/07/2016   TRIG 246 (H) 12/07/2016   CHOLHDL 5.1 12/07/2016   INR 0.93 12/06/2016   K 3.8 12/07/2016   BUN 17 12/07/2016   CREATININE 1.10 12/07/2016    Past medical and surgical history were reviewed and updated in EPIC.  Outpatient Encounter Prescriptions as of 01/19/2017  Medication Sig  . acetaminophen (TYLENOL) 500 MG tablet Take 1,000 mg by mouth every 6 (six) hours as needed for headache (pain).  Marland Kitchen aspirin 81 MG chewable tablet Chew 1 tablet (81 mg total) by mouth daily.  Marland Kitchen atorvastatin (LIPITOR) 80 MG tablet Take 1 tablet (80 mg total) by mouth daily.  . clopidogrel (PLAVIX) 75 MG tablet Take 1 tablet (75 mg total) by mouth daily. Day after you finish Eliquis take 4 tablets of Plavix and after that once daily.  . metoprolol tartrate (LOPRESSOR) 25 MG tablet Take 1 tablet (25 mg total) by mouth 2 (two) times daily.  . nitroGLYCERIN (NITROSTAT) 0.4 MG SL tablet Place 1 tablet (0.4 mg total) under the tongue every 5 (five) minutes x 3 doses as needed for chest pain.  . pantoprazole (PROTONIX) 40 MG tablet Take 1 tablet (40 mg total) by mouth daily.  . ticagrelor (BRILINTA) 90 MG TABS tablet Take 1 tablet (90 mg total) by mouth 2 (two) times daily.   No facility-administered encounter  medications on file as of 01/19/2017.     Allergies: Patient has no known allergies.  Social History   Social History  . Marital status: Single    Spouse name: N/A  . Number of children: N/A  . Years of education: N/A   Occupational History  . Construction    Social History Main Topics  . Smoking status: Former Smoker    Types: Cigarettes  . Smokeless tobacco: Never Used  . Alcohol use No  . Drug use: No  . Sexual activity: Not on file   Other Topics Concern  . Not on file   Social History Narrative  . No narrative on file    Family History  Problem Relation Age of Onset  . CAD Neg Hx     Review of Systems: A 12-system review of systems was performed and was negative except as noted in the HPI.  --------------------------------------------------------------------------------------------------  Physical Exam: There were no vitals taken for this visit.  General:  *** HEENT: No conjunctival pallor or scleral icterus.  Moist mucous membranes.  OP clear. Neck: Supple without lymphadenopathy, thyromegaly, JVD, or HJR.  No carotid bruit. Lungs: Normal work of breathing.  Clear to auscultation bilaterally without wheezes or crackles. Heart: Regular rate and rhythm without murmurs, rubs, or gallops.  Non-displaced PMI. Abd: Bowel sounds present.  Soft, NT/ND without hepatosplenomegaly Ext: No lower extremity edema.  Radial, PT, and DP pulses are 2+ bilaterally. Skin: warm and dry without rash  EKG:  ***  Lab  Results  Component Value Date   WBC 6.6 12/09/2016   HGB 13.7 12/09/2016   HCT 42.1 12/09/2016   MCV 92.3 12/09/2016   PLT 223 12/09/2016    Lab Results  Component Value Date   NA 138 12/07/2016   K 3.8 12/07/2016   CL 107 12/07/2016   CO2 22 12/07/2016   BUN 17 12/07/2016   CREATININE 1.10 12/07/2016   GLUCOSE 118 (H) 12/07/2016   ALT 43 12/07/2016    Lab Results  Component Value Date   CHOL 193 12/07/2016   HDL 38 (L) 12/07/2016   LDLCALC  106 (H) 12/07/2016   TRIG 246 (H) 12/07/2016   CHOLHDL 5.1 12/07/2016    --------------------------------------------------------------------------------------------------  ASSESSMENT AND PLAN: Andrew Deer***  Pearlina Friedly, MD 01/19/2017 2:10 PM

## 2020-09-15 ENCOUNTER — Inpatient Hospital Stay
Admission: EM | Admit: 2020-09-15 | Discharge: 2020-09-19 | DRG: 177 | Disposition: A | Discharge status: Home / Self Care | Payer: HRSA Program | Attending: Hospitalist | Admitting: Hospitalist

## 2020-09-15 ENCOUNTER — Emergency Department: Payer: HRSA Program

## 2020-09-15 ENCOUNTER — Encounter: Payer: Self-pay | Admitting: Internal Medicine

## 2020-09-15 DIAGNOSIS — Z6834 Body mass index (BMI) 34.0-34.9, adult: Secondary | ICD-10-CM

## 2020-09-15 DIAGNOSIS — I252 Old myocardial infarction: Secondary | ICD-10-CM

## 2020-09-15 DIAGNOSIS — I10 Essential (primary) hypertension: Secondary | ICD-10-CM | POA: Diagnosis present

## 2020-09-15 DIAGNOSIS — T380X5A Adverse effect of glucocorticoids and synthetic analogues, initial encounter: Secondary | ICD-10-CM | POA: Diagnosis present

## 2020-09-15 DIAGNOSIS — I2510 Atherosclerotic heart disease of native coronary artery without angina pectoris: Secondary | ICD-10-CM | POA: Diagnosis present

## 2020-09-15 DIAGNOSIS — I493 Ventricular premature depolarization: Secondary | ICD-10-CM | POA: Diagnosis present

## 2020-09-15 DIAGNOSIS — E669 Obesity, unspecified: Secondary | ICD-10-CM | POA: Diagnosis present

## 2020-09-15 DIAGNOSIS — E1165 Type 2 diabetes mellitus with hyperglycemia: Secondary | ICD-10-CM | POA: Diagnosis present

## 2020-09-15 DIAGNOSIS — I480 Paroxysmal atrial fibrillation: Secondary | ICD-10-CM | POA: Diagnosis present

## 2020-09-15 DIAGNOSIS — J1282 Pneumonia due to coronavirus disease 2019: Secondary | ICD-10-CM | POA: Diagnosis present

## 2020-09-15 DIAGNOSIS — J9601 Acute respiratory failure with hypoxia: Secondary | ICD-10-CM | POA: Diagnosis present

## 2020-09-15 DIAGNOSIS — Z87891 Personal history of nicotine dependence: Secondary | ICD-10-CM

## 2020-09-15 DIAGNOSIS — Z9861 Coronary angioplasty status: Secondary | ICD-10-CM

## 2020-09-15 DIAGNOSIS — Z955 Presence of coronary angioplasty implant and graft: Secondary | ICD-10-CM

## 2020-09-15 DIAGNOSIS — I110 Hypertensive heart disease with heart failure: Secondary | ICD-10-CM | POA: Diagnosis present

## 2020-09-15 DIAGNOSIS — U071 COVID-19: Secondary | ICD-10-CM | POA: Diagnosis present

## 2020-09-15 DIAGNOSIS — I509 Heart failure, unspecified: Secondary | ICD-10-CM | POA: Diagnosis present

## 2020-09-15 DIAGNOSIS — E785 Hyperlipidemia, unspecified: Secondary | ICD-10-CM | POA: Diagnosis present

## 2020-09-15 LAB — COMPREHENSIVE METABOLIC PANEL
ALT: 23 U/L (ref 0–44)
AST: 52 U/L — ABNORMAL HIGH (ref 15–41)
Albumin: 3.7 g/dL (ref 3.5–5.0)
Alkaline Phosphatase: 35 U/L — ABNORMAL LOW (ref 38–126)
Anion gap: 10 (ref 5–15)
BUN: 16 mg/dL (ref 8–23)
CO2: 24 mmol/L (ref 22–32)
Calcium: 8.5 mg/dL — ABNORMAL LOW (ref 8.9–10.3)
Chloride: 100 mmol/L (ref 98–111)
Creatinine, Ser: 1.14 mg/dL (ref 0.61–1.24)
GFR calc Af Amer: >60 mL/min (ref >60)
GFR calc non Af Amer: >60 mL/min (ref >60)
Glucose, Bld: 163 mg/dL — ABNORMAL HIGH (ref 70–99)
Potassium: 4.3 mmol/L (ref 3.5–5.1)
Sodium: 134 mmol/L — ABNORMAL LOW (ref 135–145)
Total Bilirubin: 1.2 mg/dL (ref 0.3–1.2)
Total Protein: 8 g/dL (ref 6.5–8.1)

## 2020-09-15 LAB — CBC
HCT: 40.8 % (ref 39.0–52.0)
Hemoglobin: 14.1 g/dL (ref 13.0–17.0)
MCH: 30.6 pg (ref 26.0–34.0)
MCHC: 34.6 g/dL (ref 30.0–36.0)
MCV: 88.5 fL (ref 80.0–100.0)
Platelets: 144 10*3/uL — ABNORMAL LOW (ref 150–400)
RBC: 4.61 MIL/uL (ref 4.22–5.81)
RDW: 14 % (ref 11.5–15.5)
WBC: 3.9 10*3/uL — ABNORMAL LOW (ref 4.0–10.5)
nRBC: 0 % (ref 0.0–0.2)

## 2020-09-15 LAB — TROPONIN I (HIGH SENSITIVITY)
Troponin I (High Sensitivity): 20 ng/L — ABNORMAL HIGH (ref <18)
Troponin I (High Sensitivity): 14 ng/L (ref <18)

## 2020-09-15 LAB — LACTIC ACID, PLASMA: Lactic Acid, Venous: 1.4 mmol/L (ref 0.5–1.9)

## 2020-09-15 LAB — BRAIN NATRIURETIC PEPTIDE: B Natriuretic Peptide: 94.8 pg/mL (ref 0.0–100.0)

## 2020-09-15 LAB — SARS CORONAVIRUS 2 BY RT PCR (HOSPITAL ORDER, PERFORMED IN ~~LOC~~ HOSPITAL LAB): SARS Coronavirus 2: POSITIVE — AB

## 2020-09-15 LAB — PROCALCITONIN: Procalcitonin: 0.3 ng/mL

## 2020-09-15 MED ORDER — MELATONIN 5 MG PO TABS
5.0000 mg | ORAL_TABLET | Freq: Every day | ORAL | Status: DC
  Administered 2020-09-16 – 2020-09-18 (×4): 5 mg via ORAL
  Filled 2020-09-15 (×6): qty 1

## 2020-09-15 MED ORDER — ONDANSETRON HCL 4 MG/2ML IJ SOLN: 4.0000 mg | Freq: Four times a day (QID) | INTRAMUSCULAR | Status: DC | PRN

## 2020-09-15 MED ORDER — ASCORBIC ACID 500 MG PO TABS
500.0000 mg | ORAL_TABLET | Freq: Every day | ORAL | Status: DC
  Administered 2020-09-16 – 2020-09-19 (×4): 500 mg via ORAL
  Filled 2020-09-15 (×4): qty 1

## 2020-09-15 MED ORDER — ACETAMINOPHEN 325 MG PO TABS: 650.0000 mg | ORAL_TABLET | Freq: Four times a day (QID) | ORAL | Status: DC | PRN

## 2020-09-15 MED ORDER — ATORVASTATIN CALCIUM 20 MG PO TABS
80.0000 mg | ORAL_TABLET | Freq: Every day | ORAL | Status: DC
  Administered 2020-09-16 – 2020-09-19 (×4): 80 mg via ORAL
  Filled 2020-09-15 (×4): qty 4

## 2020-09-15 MED ORDER — GUAIFENESIN-DM 100-10 MG/5ML PO SYRP
10.0000 mL | ORAL_SOLUTION | ORAL | Status: DC | PRN
  Filled 2020-09-15: qty 10

## 2020-09-15 MED ORDER — SODIUM CHLORIDE 0.9% FLUSH
3.0000 mL | Freq: Two times a day (BID) | INTRAVENOUS | Status: DC
  Administered 2020-09-16 – 2020-09-19 (×7): 3 mL via INTRAVENOUS

## 2020-09-15 MED ORDER — SODIUM CHLORIDE 0.9 % IV SOLN
200.0000 mg | Freq: Once | INTRAVENOUS | Status: AC
  Administered 2020-09-15: 200 mg via INTRAVENOUS
  Filled 2020-09-15: qty 200

## 2020-09-15 MED ORDER — ASPIRIN 81 MG PO CHEW
81.0000 mg | CHEWABLE_TABLET | Freq: Every day | ORAL | Status: DC
  Administered 2020-09-16 – 2020-09-19 (×4): 81 mg via ORAL
  Filled 2020-09-15 (×4): qty 1

## 2020-09-15 MED ORDER — ALBUTEROL SULFATE HFA 108 (90 BASE) MCG/ACT IN AERS
2.0000 | INHALATION_SPRAY | Freq: Four times a day (QID) | RESPIRATORY_TRACT | Status: DC | PRN
  Filled 2020-09-15: qty 6.7

## 2020-09-15 MED ORDER — ONDANSETRON HCL 4 MG PO TABS: 4.0000 mg | ORAL_TABLET | Freq: Four times a day (QID) | ORAL | Status: DC | PRN

## 2020-09-15 MED ORDER — METHYLPREDNISOLONE SODIUM SUCC 125 MG IJ SOLR
105.0000 mg | Freq: Once | INTRAMUSCULAR | Status: AC
  Administered 2020-09-15: 105 mg via INTRAVENOUS
  Filled 2020-09-15: qty 2

## 2020-09-15 MED ORDER — SODIUM CHLORIDE 0.9 % IV SOLN
100.0000 mg | Freq: Every day | INTRAVENOUS | Status: AC
  Administered 2020-09-16 – 2020-09-19 (×4): 100 mg via INTRAVENOUS
  Filled 2020-09-15 (×5): qty 20

## 2020-09-15 MED ORDER — ENOXAPARIN SODIUM 40 MG/0.4ML ~~LOC~~ SOLN
40.0000 mg | SUBCUTANEOUS | Status: DC
  Administered 2020-09-16 – 2020-09-18 (×4): 40 mg via SUBCUTANEOUS
  Filled 2020-09-15 (×4): qty 0.4

## 2020-09-15 MED ORDER — LORAZEPAM 2 MG/ML IJ SOLN
1.0000 mg | Freq: Once | INTRAMUSCULAR | Status: AC
  Administered 2020-09-15: 1 mg via INTRAVENOUS
  Filled 2020-09-15: qty 1

## 2020-09-15 MED ORDER — METOPROLOL TARTRATE 25 MG PO TABS
25.0000 mg | ORAL_TABLET | Freq: Two times a day (BID) | ORAL | Status: DC
  Administered 2020-09-16 – 2020-09-19 (×8): 25 mg via ORAL
  Filled 2020-09-15 (×8): qty 1

## 2020-09-15 MED ORDER — SODIUM CHLORIDE 0.9 % IV SOLN: Freq: Once | INTRAVENOUS | Status: AC

## 2020-09-15 MED ORDER — ACETAMINOPHEN 500 MG PO TABS
1000.0000 mg | ORAL_TABLET | Freq: Once | ORAL | Status: AC
  Administered 2020-09-15: 1000 mg via ORAL
  Filled 2020-09-15: qty 2

## 2020-09-15 MED ORDER — HYDROCOD POLST-CPM POLST ER 10-8 MG/5ML PO SUER: 5.0000 mL | Freq: Two times a day (BID) | ORAL | Status: DC | PRN

## 2020-09-15 MED ORDER — DEXAMETHASONE SODIUM PHOSPHATE 10 MG/ML IJ SOLN
6.0000 mg | INTRAMUSCULAR | Status: DC
  Administered 2020-09-16: 10:00:00 6 mg via INTRAVENOUS
  Filled 2020-09-15: qty 1

## 2020-09-15 MED ORDER — ZINC SULFATE 220 (50 ZN) MG PO CAPS
220.0000 mg | ORAL_CAPSULE | Freq: Every day | ORAL | Status: DC
  Administered 2020-09-16 – 2020-09-19 (×4): 220 mg via ORAL
  Filled 2020-09-15 (×4): qty 1

## 2020-09-15 NOTE — H&P (Signed)
History and Physical    Andrew Palmer WVP:[id number] DOB: Jul 07, 1956 DOA: 09/15/2020  PCP: Patient, No Pcp Per  Patient coming from: Home via EMS  I have personally briefly reviewed patient's old medical records in [facility]  Chief Complaint: Shortness of breath  HPI: Andrew Palmer is a Spanish-speaking  64 y.o. male with medical history significant for CAD with inferior STEMI s/p PCI with DES to mid RCA (12/06/2016), paroxysmal atrial fibrillation (at time of STEMI, not on anticoagulation), hypertension, and hyperlipidemia who presents to the ED for evaluation of progressive shortness of breath in setting of reported positive Covid test 1 week ago.  Spanish interpreter is used at bedside to facilitate communication.  Patient states he has been generally feeling unwell with frequent diaphoresis and nonproductive cough.  He feels as if he has chest congestion but is not bringing up any sputum.  He has had some shortness of breath.  He has had increasing fatigue, generalized weakness, and poor oral intake.  He reports several family members also were diagnosed with COVID-19.  He says he is not currently taking any medications.  He says he is a former smoker, quitting about 15 years ago.  He denies any known allergies to medicine.  He is not aware of any chronic medical conditions in his immediate family.  ED Course:  Initial vitals showed BP 144/97, pulse 96, RR 18, temp 100.7 Fahrenheit, SPO2 92% on room air.  Labs show WBC 3.9, hemoglobin 14.9, platelets 144,000, sodium 134, potassium 4.3, bicarb 24, BUN 16, creatinine 1.14, AST 52, ALT 23, alk phos 35, total bilirubin 1.3, procalcitonin 0.30, BNP 94.8, lactic acid 1.4.  High-sensitivity troponin I 20 > 14.  SARS-CoV-2 PCR is positive.  2 view chest x-ray shows extensive mid and lower lung interstitial and nodular airspace opacities bilaterally.  Patient was given IV Solu-Medrol 105 mg, IV Ativan 1 mg, and started on  IV remdesivir.  The hospitalist service was consulted to admit for further evaluation and management.  Review of Systems: All systems reviewed and are negative except as documented in history of present illness above.   Past Medical History:  Diagnosis Date  . CAD s/p Inferior STEMI involving the RCA. 12/06/2016   a. 11/2016 Inf STEMI/PCI: LM nl, LAD 30p/m, RCA 100 (3.5 x 24 Promus Premier DES).  . Chronic pain   . Diastolic dysfunction    a. 11/2016 Echo: EF 50-55%, inf HK, grade 1 DD, nl LA size, mildly dil RV w/ nl fxn.  . HTN, goal below 130/80 12/06/2016  . Hyperlipidemia   . PAF (paroxysmal atrial fibrillation) ([facility])    a. 11/2016 - noted @ time of inferior STEMI.    Past Surgical History:  Procedure Laterality Date  . CARDIAC CATHETERIZATION N/A 12/06/2016   Procedure: Left Heart Cath and Coronary Angiography;  Surgeon: Peter M Martinique, MD;  Location: [facility];  Service: Cardiovascular;  Laterality: N/A;  . CARDIAC CATHETERIZATION N/A 12/06/2016   Procedure: Coronary Stent Intervention;  Surgeon: Peter M Martinique, MD;  Location: [facility];  Service: Cardiovascular;  Laterality: N/A;    Social History:  reports that he has quit smoking. His smoking use included cigarettes. He has never used smokeless tobacco. He reports that he does not drink alcohol and does not use drugs.  No Known Allergies  Family History  Problem Relation Age of Onset  . CAD Neg Hx      Prior to Admission medications   Medication  Sig Start Date End Date Taking? Authorizing Provider  acetaminophen (TYLENOL) 500 MG tablet Take 1,000 mg by mouth every 6 (six) hours as needed for headache (pain). Patient not taking: Reported on 09/15/2020    [provider]  aspirin 81 MG chewable tablet Chew 1 tablet (81 mg total) by mouth daily. Patient not taking: Reported on 09/15/2020 12/10/16   Arbutus Leas, NP  atorvastatin (LIPITOR) 80 MG tablet Take 1 tablet (80 mg total) by mouth  daily. 12/14/16 03/14/17  Theora Gianotti, NP  clopidogrel (PLAVIX) 75 MG tablet Take 1 tablet (75 mg total) by mouth daily. Day after you finish Eliquis take 4 tablets of Plavix and after that once daily. Patient not taking: Reported on 09/15/2020 12/14/16   Theora Gianotti, NP  metoprolol tartrate (LOPRESSOR) 25 MG tablet Take 1 tablet (25 mg total) by mouth 2 (two) times daily. Patient not taking: Reported on 09/15/2020 12/09/16   Arbutus Leas, NP  nitroGLYCERIN (NITROSTAT) 0.4 MG SL tablet Place 1 tablet (0.4 mg total) under the tongue every 5 (five) minutes x 3 doses as needed for chest pain. Patient not taking: Reported on 09/15/2020 12/09/16   Arbutus Leas, NP  pantoprazole (PROTONIX) 40 MG tablet Take 1 tablet (40 mg total) by mouth daily. Patient not taking: Reported on 09/15/2020 12/10/16   Arbutus Leas, NP  ticagrelor (BRILINTA) 90 MG TABS tablet Take 1 tablet (90 mg total) by mouth 2 (two) times daily. Patient not taking: Reported on 09/15/2020 12/09/16   Arbutus Leas, NP    Physical Exam: Vitals:   09/15/20 1012 09/15/20 1500 09/15/20 1700  BP: (!) 144/97 139/87 125/87  Pulse: (!) 51 (!) 48 (!) 49  Resp: 18 (!) 21 (!) 30  Temp: (!) 100.7 F (38.2 C)    TempSrc: Oral    SpO2: 92% 97% 91%  Weight: 104.3 kg    Height: 5' 8" (1.727 m)     Constitutional: Obese man resting supine in bed, NAD, calm, comfortable Eyes: PERRL, lids and conjunctivae normal ENMT: Mucous membranes are moist. Posterior pharynx clear of any exudate or lesions.  Neck: normal, supple, no masses. Respiratory: Bibasilar inspiratory crackles.  Normal respiratory effort. No accessory muscle use.  Cardiovascular: Regular rate and rhythm, 2/6 systolic murmur present. No extremity edema. 2+ pedal pulses. Abdomen: no tenderness, no masses palpated. No hepatosplenomegaly. Bowel sounds positive.  Musculoskeletal: no clubbing / cyanosis. No joint deformity upper and lower extremities. Good ROM,  no contractures. Normal muscle tone.  Skin: Diaphoretic, no rashes, lesions, ulcers. No induration Neurologic: CN 2-12 grossly intact. Sensation intact, Strength 5/5 in all 4.  Psychiatric: Normal judgment and insight. Alert and oriented x 3. Normal mood.  Labs on Admission: I have personally reviewed following labs and imaging studies  CBC: Recent Labs  Lab 09/15/20 1017  WBC 3.9*  HGB 14.1  HCT 40.8  MCV 88.5  PLT 846*   Basic Metabolic Panel: Recent Labs  Lab 09/15/20 1017  NA 134*  K 4.3  CL 100  CO2 24  GLUCOSE 163*  BUN 16  CREATININE 1.14  CALCIUM 8.5*   GFR: Estimated Creatinine Clearance: 77.7 mL/min (by C-G formula based on SCr of 1.14 mg/dL). Liver Function Tests: Recent Labs  Lab 09/15/20 1017  AST 52*  ALT 23  ALKPHOS 35*  BILITOT 1.2  PROT 8.0  ALBUMIN 3.7   No results for input(s): LIPASE, AMYLASE in the last 168 hours. No results for input(s): AMMONIA in  the last 168 hours. Coagulation Profile: No results for input(s): INR, PROTIME in the last 168 hours. Cardiac Enzymes: No results for input(s): CKTOTAL, CKMB, CKMBINDEX, TROPONINI in the last 168 hours. BNP (last 3 results) No results for input(s): PROBNP in the last 8760 hours. HbA1C: No results for input(s): HGBA1C in the last 72 hours. CBG: No results for input(s): GLUCAP in the last 168 hours. Lipid Profile: No results for input(s): CHOL, HDL, LDLCALC, TRIG, CHOLHDL, LDLDIRECT in the last 72 hours. Thyroid Function Tests: No results for input(s): TSH, T4TOTAL, FREET4, T3FREE, THYROIDAB in the last 72 hours. Anemia Panel: No results for input(s): VITAMINB12, FOLATE, FERRITIN, TIBC, IRON, RETICCTPCT in the last 72 hours. Urine analysis: No results found for: COLORURINE, APPEARANCEUR, LABSPEC, PHURINE, GLUCOSEU, HGBUR, BILIRUBINUR, KETONESUR, PROTEINUR, UROBILINOGEN, NITRITE, LEUKOCYTESUR  Radiological Exams on Admission: DG Chest 2 View  Result Date: 09/15/2020 CLINICAL DATA:   History of COVID pneumonia.  Former smoker. EXAM: CHEST - 2 VIEW COMPARISON:  None. FINDINGS: Normal cardiac silhouette and mediastinal contours. Extensive bilateral peripheral and basilar predominant nodular interstitial and airspace opacities with relative sparing of the right costophrenic angle. No definite pleural effusion or pneumothorax. No evidence of edema. No acute osseous abnormalities. Mild-to-moderate focal scoliotic curvature of the thoracolumbar spine with dominant mid component convex to the right. IMPRESSION: Rather extensive bilateral mid and lower lung peripheral predominant interstitial and nodular airspace opacities compatible with provided history of COVID pneumonia. Electronically Signed   By: Sandi Mariscal M.D.   On: 09/15/2020 11:16    EKG: Independently reviewed. Sinus tachycardia with PVC and PACs.  Last EKG showed normal sinus rhythm in 2017.  Assessment/Plan Principal Problem:   Pneumonia due to COVID-19 virus Active Problems:   Essential hypertension   PAF (paroxysmal atrial fibrillation) (HCC)   CAD S/P percutaneous coronary angioplasty   Hyperlipidemia with target low density lipoprotein (LDL) cholesterol less than 70 mg/dL  Andrew Palmer is a Spanish-speaking  64 y.o. male with medical history significant for CAD with inferior STEMI s/p PCI with DES to mid RCA (12/06/2016), paroxysmal atrial fibrillation (at time of STEMI, not on anticoagulation), hypertension, and hyperlipidemia who is admitted with COVID-19 pneumonia.  COVID-19 pneumonia: Reported positive Covid test 1 week prior to admission, not on our file.  SARS-CoV-2 PCR + 09/15/2020 here.  CXR with bilateral opacities consistent with Covid pneumonia.  O2 saturation down to 91% on room air.  Currently requiring 2 L supplemental O2 via [place]. -Continue IV remdesivir per protocol -Continue IV Decadron 6 mg daily -Continue supple oxygen as needed and wean as able -Continue flutter valve, incentive  spirometer, albuterol as needed -Antitussives, vitamin C, zinc  CAD/Hx of STEMI s/p PCI with DES to mid RCA 12/06/2016: Chronic and stable.  Denies any current chest pain.  He states he is not currently taking any medications. -Restart aspirin 81 mg daily -Restart atorvastatin 80 mg daily -Restart Lopressor 25 mg twice daily  Paroxysmal atrial fibrillation: Noted only at time of his STEMI therefore not on chronic anticoagulation.  Currently in sinus rhythm but with frequent PVCs/PVCs. -Monitor on telemetry -Restart Lopressor 25 mg twice daily  Hypertension: Restart Lopressor as above.  Hyperlipidemia: Restart atorvastatin as above.  DVT prophylaxis: Lovenox Code Status: Full code, confirmed with patient Family Communication: Discussed with patient, he has discussed with family Disposition Plan: From home and likely discharge home pending clinical improvement and ability to transition to outpatient therapy. Consults called: None Admission status:  Status is: Inpatient  Remains inpatient  appropriate because:IV treatments appropriate due to intensity of illness or inability to take PO and Inpatient level of care appropriate due to severity of illness   Dispo: The patient is from: Home              Anticipated d/c is to: Home              Anticipated d/c date is: 2 days              Patient currently is not medically stable to d/c.   Zada Finders MD [facility]  If 7PM-7AM, please contact night-coverage [email or web address]  09/15/2020, 6:27 PM

## 2020-09-15 NOTE — ED Notes (Signed)
Covid positive 7 days ago -  to ED for shortness of breath and chest pain. Per EMS has frequent PVCs, 4L oxygen brought him to 94% and NS bolus of 200 mL calmed PVCs down a bit. Will need an interpretor. Has had persistent cough.

## 2020-09-15 NOTE — ED Provider Notes (Signed)
[facility] Emergency Department Provider Note  ____________________________________________   First MD Initiated Contact with Patient 09/15/20 1459     (approximate)  I have reviewed the triage vital signs and the nursing notes.   HISTORY  Chief Complaint Covid Positive    HPI Andrew Palmer is a 64 y.o. male with history of A. fib, CHF, coronary disease, here with cough, shortness of breath, and generalized weakness.  The patient states that he was diagnosed with Covid approximately week ago.  He states that over the last week, he has had cough, shortness of breath, and general fatigue.  He has been having difficulty sleeping due to sensation of shortness of breath.  He has had diffuse chest pain, worse with coughing.  Denies any leg swelling.  He has had some mild nausea and loss of appetite but no vomiting or diarrhea.  He believes he got this from his niece.  No specific alleviating aggravating factors.  He has not take any medications for this.        Past Medical History:  Diagnosis Date  . CAD s/p Inferior STEMI involving the RCA. 12/06/2016   a. 11/2016 Inf STEMI/PCI: LM nl, LAD 30p/m, RCA 100 (3.5 x 24 Promus Premier DES).  . Chronic pain   . Diastolic dysfunction    a. 11/2016 Echo: EF 50-55%, inf HK, grade 1 DD, nl LA size, mildly dil RV w/ nl fxn.  . HTN, goal below 130/80 12/06/2016  . Hyperlipidemia   . PAF (paroxysmal atrial fibrillation) (HCC)    a. 11/2016 - noted @ time of inferior STEMI.    Patient Active Problem List   Diagnosis Date Noted  . Pneumonia due to COVID-19 virus 09/15/2020  . CAD S/P percutaneous coronary angioplasty 12/07/2016    Class: Diagnosis of  . Hyperlipidemia with target low density lipoprotein (LDL) cholesterol less than 70 mg/dL 50/08/3817  . ST elevation (STEMI) myocardial infarction involving right coronary artery (HCC) 12/06/2016  . Essential hypertension 12/06/2016  . PAF (paroxysmal atrial  fibrillation) (HCC) 12/06/2016    Past Surgical History:  Procedure Laterality Date  . CARDIAC CATHETERIZATION N/A 12/06/2016   Procedure: Left Heart Cath and Coronary Angiography;  Surgeon: Peter M Swaziland, MD;  Location: [facility] INVASIVE CV LAB;  Service: Cardiovascular;  Laterality: N/A;  . CARDIAC CATHETERIZATION N/A 12/06/2016   Procedure: Coronary Stent Intervention;  Surgeon: Peter M Swaziland, MD;  Location: [facility] INVASIVE CV LAB;  Service: Cardiovascular;  Laterality: N/A;    Prior to Admission medications   Medication Sig Start Date End Date Taking? Authorizing Provider  acetaminophen (TYLENOL) 500 MG tablet Take 1,000 mg by mouth every 6 (six) hours as needed for headache (pain). Patient not taking: Reported on 09/15/2020    [provider]  aspirin 81 MG chewable tablet Chew 1 tablet (81 mg total) by mouth daily. Patient not taking: Reported on 09/15/2020 12/10/16   Little Ishikawa, NP  atorvastatin (LIPITOR) 80 MG tablet Take 1 tablet (80 mg total) by mouth daily. 12/14/16 03/14/17  Creig Hines, NP  clopidogrel (PLAVIX) 75 MG tablet Take 1 tablet (75 mg total) by mouth daily. Day after you finish Eliquis take 4 tablets of Plavix and after that once daily. Patient not taking: Reported on 09/15/2020 12/14/16   Creig Hines, NP  metoprolol tartrate (LOPRESSOR) 25 MG tablet Take 1 tablet (25 mg total) by mouth 2 (two) times daily. Patient not taking: Reported on 09/15/2020 12/09/16   Little Ishikawa,  NP  nitroGLYCERIN (NITROSTAT) 0.4 MG SL tablet Place 1 tablet (0.4 mg total) under the tongue every 5 (five) minutes x 3 doses as needed for chest pain. Patient not taking: Reported on 09/15/2020 12/09/16   Little Ishikawa, NP  pantoprazole (PROTONIX) 40 MG tablet Take 1 tablet (40 mg total) by mouth daily. Patient not taking: Reported on 09/15/2020 12/10/16   Little Ishikawa, NP  ticagrelor (BRILINTA) 90 MG TABS tablet Take 1 tablet (90 mg total) by mouth 2 (two) times  daily. Patient not taking: Reported on 09/15/2020 12/09/16   Little Ishikawa, NP    Allergies Patient has no known allergies.  Family History  Problem Relation Age of Onset  . CAD Neg Hx     Social History Social History   Tobacco Use  . Smoking status: Former Smoker    Types: Cigarettes  . Smokeless tobacco: Never Used  Substance Use Topics  . Alcohol use: No  . Drug use: No    Review of Systems  Review of Systems  Constitutional: Positive for chills, fatigue and fever.  HENT: Negative for sore throat.   Respiratory: Positive for cough and shortness of breath.   Cardiovascular: Negative for chest pain.  Gastrointestinal: Positive for nausea. Negative for abdominal pain.  Genitourinary: Negative for flank pain.  Musculoskeletal: Negative for neck pain.  Skin: Negative for rash and wound.  Allergic/Immunologic: Negative for immunocompromised state.  Neurological: Positive for weakness and headaches. Negative for numbness.  Hematological: Does not bruise/bleed easily.  All other systems reviewed and are negative.    ____________________________________________  PHYSICAL EXAM:      VITAL SIGNS: ED Triage Vitals [09/15/20 1012]  Enc Vitals Group     BP (!) 144/97     Pulse Rate (!) 51     Resp 18     Temp (!) 100.7 F (38.2 C)     Temp Source Oral     SpO2 92 %     Weight 230 lb (104.3 kg)     Height 5\' 8"  (1.727 m)     Head Circumference      Peak Flow      Pain Score      Pain Loc      Pain Edu?      Excl. in GC?      Physical Exam Vitals and nursing note reviewed.  Constitutional:      General: He is not in acute distress.    Appearance: He is well-developed.  HENT:     Head: Normocephalic and atraumatic.     Mouth/Throat:     Mouth: Mucous membranes are dry.  Eyes:     Conjunctiva/sclera: Conjunctivae normal.  Cardiovascular:     Rate and Rhythm: Tachycardia present. Rhythm irregular.     Heart sounds: Normal heart sounds. No murmur heard.   No friction rub.  Pulmonary:     Effort: Pulmonary effort is normal. Tachypnea present. No respiratory distress.     Breath sounds: Examination of the right-middle field reveals rales. Examination of the left-middle field reveals rales. Examination of the right-lower field reveals rales. Examination of the left-lower field reveals rales. Rales present. No wheezing.  Abdominal:     General: There is no distension.     Palpations: Abdomen is soft.     Tenderness: There is no abdominal tenderness.  Musculoskeletal:     Cervical back: Neck supple.  Skin:    General: Skin is warm.     Capillary Refill:  Capillary refill takes less than 2 seconds.  Neurological:     Mental Status: He is alert and oriented to person, place, and time.     Motor: No abnormal muscle tone.       ____________________________________________   LABS (all labs ordered are listed, but only abnormal results are displayed)  Labs Reviewed  SARS CORONAVIRUS 2 BY RT PCR (HOSPITAL ORDER, PERFORMED IN [facility]) - Abnormal; Notable for the following components:      Result Value   SARS Coronavirus 2 POSITIVE (*)    All other components within normal limits  CBC - Abnormal; Notable for the following components:   WBC 3.9 (*)    Platelets 144 (*)    All other components within normal limits  COMPREHENSIVE METABOLIC PANEL - Abnormal; Notable for the following components:   Sodium 134 (*)    Glucose, Bld 163 (*)    Calcium 8.5 (*)    AST 52 (*)    Alkaline Phosphatase 35 (*)    All other components within normal limits  TROPONIN I (HIGH SENSITIVITY) - Abnormal; Notable for the following components:   Troponin I (High Sensitivity) 20 (*)    All other components within normal limits  PROCALCITONIN  BRAIN NATRIURETIC PEPTIDE  LACTIC ACID, PLASMA  C-REACTIVE PROTEIN  FIBRIN DERIVATIVES D-DIMER ([facility] ONLY)  HIV ANTIBODY (ROUTINE TESTING W REFLEX)  CBC WITH DIFFERENTIAL/PLATELET  COMPREHENSIVE  METABOLIC PANEL  C-REACTIVE PROTEIN  FIBRIN DERIVATIVES D-DIMER ([facility] ONLY)  FERRITIN  MAGNESIUM  PHOSPHORUS  HEMOGLOBIN A1C  TROPONIN I (HIGH SENSITIVITY)    ____________________________________________  EKG: Sinus tachycardia with first-degree AV block.  Ventricular rate 96, QRS 90, QTc 429.  Frequent PACs, often in couplets. ________________________________________  RADIOLOGY All imaging, including plain films, CT scans, and ultrasounds, independently reviewed by me, and interpretations confirmed via formal radiology reads.  ED MD interpretation:   Chest x-ray: Bilateral pneumonia consistent with Covid pneumonia  Official radiology report(s): DG Chest 2 View  Result Date: 09/15/2020 CLINICAL DATA:  History of COVID pneumonia.  Former smoker. EXAM: CHEST - 2 VIEW COMPARISON:  None. FINDINGS: Normal cardiac silhouette and mediastinal contours. Extensive bilateral peripheral and basilar predominant nodular interstitial and airspace opacities with relative sparing of the right costophrenic angle. No definite pleural effusion or pneumothorax. No evidence of edema. No acute osseous abnormalities. Mild-to-moderate focal scoliotic curvature of the thoracolumbar spine with dominant mid component convex to the right. IMPRESSION: Rather extensive bilateral mid and lower lung peripheral predominant interstitial and nodular airspace opacities compatible with provided history of COVID pneumonia. Electronically Signed   By: Simonne Come M.D.   On: 09/15/2020 11:16    ____________________________________________  PROCEDURES   Procedure(s) performed (including Critical Care):  .Critical Care Performed by: Shaune Pollack, MD Authorized by: Shaune Pollack, MD   Critical care provider statement:    Critical care time (minutes):  35   Critical care time was exclusive of:  Separately billable procedures and treating other patients and teaching time   Critical care was necessary to treat or  prevent imminent or life-threatening deterioration of the following conditions:  Cardiac failure, circulatory failure and respiratory failure   Critical care was time spent personally by me on the following activities:  Development of treatment plan with patient or surrogate, discussions with consultants, evaluation of patient's response to treatment, examination of patient, obtaining history from patient or surrogate, ordering and performing treatments and interventions, ordering and review of laboratory studies, ordering and review of radiographic  studies, pulse oximetry, re-evaluation of patient's condition and review of old charts   I assumed direction of critical care for this patient from another provider in my specialty: no      ____________________________________________  INITIAL IMPRESSION / MDM / ASSESSMENT AND PLAN / ED COURSE  As part of my medical decision making, I reviewed the following data within the electronic MEDICAL RECORD NUMBER Nursing notes reviewed and incorporated, Old chart reviewed, Notes from prior ED visits, and [place] Controlled Substance Database       *Adrian PrinceJuan Ricardo Palmer was evaluated in Emergency Department on 09/15/2020 for the symptoms described in the history of present illness. He was evaluated in the context of the global COVID-19 pandemic, which necessitated consideration that the patient might be at risk for infection with the SARS-CoV-2 virus that causes COVID-19. Institutional protocols and algorithms that pertain to the evaluation of patients at risk for COVID-19 are in a state of rapid change based on information released by regulatory bodies including the CDC and federal and state organizations. These policies and algorithms were followed during the patient's care in the ED.  Some ED evaluations and interventions may be delayed as a result of limited staffing during the pandemic.*  Clinical Course as of Sep 16 1999  Sun Sep 15, 2020  [id number] 64 year old male  here with fever, hypoxia in setting of COVID-19.  Labs show mild leukopenia, elevated AST consistent with platelets likely viral process.  Procalcitonin is 0.3.  Lactate 1.4, troponin XIV, BNP normal.  No evidence of significant complications.  Chest x-ray does show bilateral pneumonia consistent with Covid.  Will start on steroids, remdesivir, plan to admit.   [CI]    Clinical Course User Index [CI] Shaune PollackIsaacs, Verenis Nicosia, MD    Medical Decision Making:  As above. Admit to medicine.  ____________________________________________  FINAL CLINICAL IMPRESSION(S) / ED DIAGNOSES  Final diagnoses:  Acute hypoxemic respiratory failure due to COVID-19 [facility](HCC)     MEDICATIONS GIVEN DURING THIS VISIT:  Medications  remdesivir 200 mg in sodium chloride 0.9% 250 mL IVPB (0 mg Intravenous Stopped 09/15/20 1818)    Followed by  remdesivir 100 mg in sodium chloride 0.9 % 100 mL IVPB (has no administration in time range)  enoxaparin (LOVENOX) injection 40 mg (has no administration in time range)  sodium chloride flush (NS) 0.9 % injection 3 mL (has no administration in time range)  albuterol (VENTOLIN HFA) 108 (90 Base) MCG/ACT inhaler 2 puff (has no administration in time range)  dexamethasone (DECADRON) injection 6 mg (has no administration in time range)  acetaminophen (TYLENOL) tablet 650 mg (has no administration in time range)  ondansetron (ZOFRAN) tablet 4 mg (has no administration in time range)    Or  ondansetron (ZOFRAN) injection 4 mg (has no administration in time range)  melatonin tablet 5 mg (has no administration in time range)  guaiFENesin-dextromethorphan (ROBITUSSIN DM) 100-10 MG/5ML syrup 10 mL (has no administration in time range)  chlorpheniramine-HYDROcodone (TUSSIONEX) 10-8 MG/5ML suspension 5 mL (has no administration in time range)  ascorbic acid (VITAMIN C) tablet 500 mg (has no administration in time range)  zinc sulfate capsule 220 mg (has no administration in time range)   atorvastatin (LIPITOR) tablet 80 mg (has no administration in time range)  aspirin chewable tablet 81 mg (has no administration in time range)  metoprolol tartrate (LOPRESSOR) tablet 25 mg (has no administration in time range)  methylPREDNISolone sodium succinate (SOLU-MEDROL) 125 mg/2 mL injection 105 mg (105 mg Intravenous Given 09/15/20 1552)  LORazepam (ATIVAN) injection 1 mg (1 mg Intravenous Given 09/15/20 1646)  0.9 %  sodium chloride infusion ( Intravenous New Bag/Given 09/15/20 1648)  acetaminophen (TYLENOL) tablet 1,000 mg (1,000 mg Oral Given 09/15/20 1646)     ED Discharge Orders    None       Note:  This document was prepared using Dragon voice recognition software and may include unintentional dictation errors.   Shaune Pollack, MD 09/15/20 2000

## 2020-09-15 NOTE — Consult Note (Signed)
Remdesivir - Pharmacy Brief Note   O:  ALT: 23 CXR: 9/19 Rather extensive bilateral mid and lower lung peripheral predominant interstitial and nodular airspace opacities compatible with provided history of COVID pneumonia SpO2: 92% on RA - patient was placed on oxygen in triage (4L brought him to 94%)   A/P:  Remdesivir 200 mg IVPB once followed by 100 mg IVPB daily x 4 days.   Raiford Noble, PharmD Pharmacy Resident  09/15/2020 3:23 PM

## 2020-09-15 NOTE — ED Triage Notes (Signed)
Patient to ED from home via ambulance for cough from Covid complications. Patient states he had some chest tightness and abdominal pain as well. Patient has lower oxygen sats so placed on oygen in traige.

## 2020-09-15 NOTE — ED Notes (Signed)
Admitting MD is bedside for evaluation with interpreter

## 2020-09-16 ENCOUNTER — Other Ambulatory Visit: Payer: Self-pay

## 2020-09-16 ENCOUNTER — Encounter: Payer: Self-pay | Admitting: Internal Medicine

## 2020-09-16 LAB — COMPREHENSIVE METABOLIC PANEL
ALT: 28 U/L (ref 0–44)
AST: 45 U/L — ABNORMAL HIGH (ref 15–41)
Albumin: 3.2 g/dL — ABNORMAL LOW (ref 3.5–5.0)
Alkaline Phosphatase: 32 U/L — ABNORMAL LOW (ref 38–126)
Anion gap: 9 (ref 5–15)
BUN: 28 mg/dL — ABNORMAL HIGH (ref 8–23)
CO2: 25 mmol/L (ref 22–32)
Calcium: 8.7 mg/dL — ABNORMAL LOW (ref 8.9–10.3)
Chloride: 100 mmol/L (ref 98–111)
Creatinine, Ser: 1.18 mg/dL (ref 0.61–1.24)
GFR calc Af Amer: >60 mL/min (ref >60)
GFR calc non Af Amer: >60 mL/min (ref >60)
Glucose, Bld: 299 mg/dL — ABNORMAL HIGH (ref 70–99)
Potassium: 4 mmol/L (ref 3.5–5.1)
Sodium: 134 mmol/L — ABNORMAL LOW (ref 135–145)
Total Bilirubin: 0.6 mg/dL (ref 0.3–1.2)
Total Protein: 7.6 g/dL (ref 6.5–8.1)

## 2020-09-16 LAB — FERRITIN: Ferritin: 809 ng/mL — ABNORMAL HIGH (ref 24–336)

## 2020-09-16 LAB — HEMOGLOBIN A1C
Hgb A1c MFr Bld: 6.8 % — ABNORMAL HIGH (ref 4.8–5.6)
Mean Plasma Glucose: 148.46 mg/dL

## 2020-09-16 LAB — HIV ANTIBODY (ROUTINE TESTING W REFLEX): HIV Screen 4th Generation wRfx: NONREACTIVE

## 2020-09-16 LAB — CBC WITH DIFFERENTIAL/PLATELET
Abs Immature Granulocytes: 0.01 10*3/uL (ref 0.00–0.07)
Basophils Absolute: 0 10*3/uL (ref 0.0–0.1)
Basophils Relative: 0 %
Eosinophils Absolute: 0 10*3/uL (ref 0.0–0.5)
Eosinophils Relative: 0 %
HCT: 41.2 % (ref 39.0–52.0)
Hemoglobin: 14.2 g/dL (ref 13.0–17.0)
Immature Granulocytes: 0 %
Lymphocytes Relative: 17 %
Lymphs Abs: 0.5 10*3/uL — ABNORMAL LOW (ref 0.7–4.0)
MCH: 30.7 pg (ref 26.0–34.0)
MCHC: 34.5 g/dL (ref 30.0–36.0)
MCV: 89.2 fL (ref 80.0–100.0)
Monocytes Absolute: 0.1 10*3/uL (ref 0.1–1.0)
Monocytes Relative: 5 %
Neutro Abs: 2.5 10*3/uL (ref 1.7–7.7)
Neutrophils Relative %: 78 %
Platelets: 164 10*3/uL (ref 150–400)
RBC: 4.62 MIL/uL (ref 4.22–5.81)
RDW: 14.1 % (ref 11.5–15.5)
WBC: 3.1 10*3/uL — ABNORMAL LOW (ref 4.0–10.5)
nRBC: 0 % (ref 0.0–0.2)

## 2020-09-16 LAB — FIBRIN DERIVATIVES D-DIMER (ARMC ONLY): Fibrin derivatives D-dimer (ARMC): 635.58 ng/mL (FEU) — ABNORMAL HIGH (ref 0.00–499.00)

## 2020-09-16 LAB — GLUCOSE, CAPILLARY
Glucose-Capillary: 178 mg/dL — ABNORMAL HIGH (ref 70–99)
Glucose-Capillary: 288 mg/dL — ABNORMAL HIGH (ref 70–99)

## 2020-09-16 LAB — C-REACTIVE PROTEIN: CRP: 13.6 mg/dL — ABNORMAL HIGH (ref <1.0)

## 2020-09-16 LAB — MAGNESIUM: Magnesium: 2.2 mg/dL (ref 1.7–2.4)

## 2020-09-16 LAB — PHOSPHORUS: Phosphorus: 4.2 mg/dL (ref 2.5–4.6)

## 2020-09-16 MED ORDER — INFLUENZA VAC SPLIT QUAD 0.5 ML IM SUSY
0.5000 mL | PREFILLED_SYRINGE | INTRAMUSCULAR | Status: DC
  Filled 2020-09-16: qty 0.5

## 2020-09-16 MED ORDER — INSULIN ASPART 100 UNIT/ML ~~LOC~~ SOLN
0.0000 [IU] | Freq: Every day | SUBCUTANEOUS | Status: DC
  Administered 2020-09-16 – 2020-09-17 (×2): 3 [IU] via SUBCUTANEOUS
  Administered 2020-09-18: 2 [IU] via SUBCUTANEOUS
  Filled 2020-09-16 (×3): qty 1

## 2020-09-16 MED ORDER — METHYLPREDNISOLONE SODIUM SUCC 125 MG IJ SOLR
60.0000 mg | Freq: Two times a day (BID) | INTRAMUSCULAR | Status: DC
  Administered 2020-09-16 – 2020-09-17 (×3): 60 mg via INTRAVENOUS
  Filled 2020-09-16 (×4): qty 2

## 2020-09-16 MED ORDER — INSULIN ASPART 100 UNIT/ML ~~LOC~~ SOLN
0.0000 [IU] | Freq: Three times a day (TID) | SUBCUTANEOUS | Status: DC
  Administered 2020-09-16 – 2020-09-17 (×3): 3 [IU] via SUBCUTANEOUS
  Administered 2020-09-17: 16:00:00 5 [IU] via SUBCUTANEOUS
  Administered 2020-09-18: 8 [IU] via SUBCUTANEOUS
  Administered 2020-09-18: 3 [IU] via SUBCUTANEOUS
  Administered 2020-09-18: 5 [IU] via SUBCUTANEOUS
  Administered 2020-09-19 (×2): 2 [IU] via SUBCUTANEOUS
  Filled 2020-09-16 (×8): qty 1

## 2020-09-16 NOTE — Progress Notes (Signed)
PROGRESS NOTE    Andrew Palmer  HTD:[id number] DOB: 21-Jul-1956 DOA: 09/15/2020 PCP: Patient, No Pcp Per    Assessment & Plan:   Principal Problem:   Pneumonia due to COVID-19 virus Active Problems:   Essential hypertension   PAF (paroxysmal atrial fibrillation) (HCC)   CAD S/P percutaneous coronary angioplasty   Hyperlipidemia with target low density lipoprotein (LDL) cholesterol less than 70 mg/dL    Andrew Palmer is a Spanish-speaking  64 y.o. male with medical history significant for CAD with inferior STEMI s/p PCI with DES to mid RCA (12/06/2016), paroxysmal atrial fibrillation (at time of STEMI, not on anticoagulation), hypertension, and hyperlipidemia who presents to the ED for evaluation of progressive shortness of breath in setting of reported positive Covid test 1 week ago.   COVID-19 pneumonia Reported positive Covid test 1 week prior to admission, not on our file.  SARS-CoV-2 PCR + 09/15/2020 here.  CXR with bilateral opacities consistent with Covid pneumonia.   --89% on 1L --CRP 13.6 PLAN: -Continue IV remdesivir per protocol --increase steroid to solumedrol 60 mg BID due to elevated CRP -Continue supple oxygen as needed and wean as able -Continue flutter valve, incentive spirometer, albuterol as needed -Antitussives, vitamin C, zinc  CAD/Hx of STEMI s/p PCI with DES to mid RCA 12/06/2016: Chronic and stable.  Denies any current chest pain.  He states he is not currently taking any medications. --pt restarted on ASA 81, lipitor 80 mg daily and Lopressor 25 mg BID on admission. PLAN: --continue current regimen.  Paroxysmal atrial fibrillation: Noted only at time of his STEMI therefore not on chronic anticoagulation.  Currently in sinus rhythm but with frequent PVCs/PVCs. --pt restarted on Lopressor 25 mg BID on admission PLAN: -d/c tele --continue lopressor  Hypertension: --pt restarted on Lopressor 25 mg BID on admission --continue  Lopressor  Hyperlipidemia: --pt restarted on lipitor on presentation. --continue Lipitor   DVT prophylaxis: Lovenox SQ Code Status: Full code  Family Communication:  Status is: inpatient Dispo:   The patient is from: home Anticipated d/c is to: home Anticipated d/c date is: tomorrow Patient currently is not medically stable to d/c due to: COVID on Remdesivir   Subjective and Interval History:  iPad translator used.  Pt reported cough.  No N/V.  Watery stool.  No appetite.  Felt better since presentation.   Objective: Vitals:   09/16/20 0428 09/16/20 0719 09/16/20 1132 09/16/20 1546  BP: 107/77 111/79 130/80 123/82  Pulse: (!) 55 62 60 78  Resp: 16 14 18 20   Temp: 98.1 F (36.7 C) 98.1 F (36.7 C) 97.9 F (36.6 C) 97.8 F (36.6 C)  TempSrc: Oral Oral Oral Oral  SpO2: 96% (!) 89% 91% (!) 86%  Weight:      Height:        Intake/Output Summary (Last 24 hours) at 09/16/2020 1554 Last data filed at 09/15/2020 2300 Gross per 24 hour  Intake 490 ml  Output --  Net 490 ml   Filed Weights   09/15/20 1012 09/15/20 2011  Weight: 104.3 kg 103.8 kg    Examination:   Constitutional: NAD, AAOx3 HEENT: conjunctivae and lids normal, EOMI CV: No cyanosis.   RESP: normal respiratory effort, on 1L MSK: normal ROM and strength, no joint enlargement or tenderness of both UE and LE SKIN: warm, dry and intact Neuro: II - XII grossly intact.  Sensation intact Psych: Normal mood and affect.  Appropriate judgement and reason   Data Reviewed: I have personally reviewed  following labs and imaging studies  CBC: Recent Labs  Lab 09/15/20 1017 09/16/20 0356  WBC 3.9* 3.1*  NEUTROABS  --  2.5  HGB 14.1 14.2  HCT 40.8 41.2  MCV 88.5 89.2  PLT 144* 164   Basic Metabolic Panel: Recent Labs  Lab 09/15/20 1017 09/16/20 0356  NA 134* 134*  K 4.3 4.0  CL 100 100  CO2 24 25  GLUCOSE 163* 299*  BUN 16 28*  CREATININE 1.14 1.18  CALCIUM 8.5* 8.7*  MG  --  2.2  PHOS  --   4.2   GFR: Estimated Creatinine Clearance: 74.9 mL/min (by C-G formula based on SCr of 1.18 mg/dL). Liver Function Tests: Recent Labs  Lab 09/15/20 1017 09/16/20 0356  AST 52* 45*  ALT 23 28  ALKPHOS 35* 32*  BILITOT 1.2 0.6  PROT 8.0 7.6  ALBUMIN 3.7 3.2*   No results for input(s): LIPASE, AMYLASE in the last 168 hours. No results for input(s): AMMONIA in the last 168 hours. Coagulation Profile: No results for input(s): INR, PROTIME in the last 168 hours. Cardiac Enzymes: No results for input(s): CKTOTAL, CKMB, CKMBINDEX, TROPONINI in the last 168 hours. BNP (last 3 results) No results for input(s): PROBNP in the last 8760 hours. HbA1C: Recent Labs    09/16/20 0356  HGBA1C 6.8*   CBG: No results for input(s): GLUCAP in the last 168 hours. Lipid Profile: No results for input(s): CHOL, HDL, LDLCALC, TRIG, CHOLHDL, LDLDIRECT in the last 72 hours. Thyroid Function Tests: No results for input(s): TSH, T4TOTAL, FREET4, T3FREE, THYROIDAB in the last 72 hours. Anemia Panel: Recent Labs    09/16/20 0356  FERRITIN 809*   Sepsis Labs: Recent Labs  Lab 09/15/20 1017 09/15/20 1554  PROCALCITON 0.30  --   LATICACIDVEN  --  1.4    Recent Results (from the past 240 hour(s))  SARS Coronavirus 2 by RT PCR (hospital order, performed in [facility] hospital lab) Nasopharyngeal Nasopharyngeal Swab     Status: Abnormal   Collection Time: 09/15/20  4:49 PM   Specimen: Nasopharyngeal Swab  Result Value Ref Range Status   SARS Coronavirus 2 POSITIVE (A) NEGATIVE Final    Comment: RESULT CALLED TO, READ BACK BY AND VERIFIED WITH: HUNTER ORE AT 1741 09/15/20.PMF (NOTE) SARS-CoV-2 target nucleic acids are DETECTED  SARS-CoV-2 RNA is generally detectable in upper respiratory specimens  during the acute phase of infection.  Positive results are indicative  of the presence of the identified virus, but do not rule out bacterial infection or co-infection with other pathogens  not detected by the test.  Clinical correlation with patient history and  other diagnostic information is necessary to determine patient infection status.  The expected result is negative.  Fact Sheet for Patients:   BoilerBrush.com.cy   Fact Sheet for Healthcare Providers:   https://pope.com/    This test is not yet approved or cleared by the [place] FDA and  has been authorized for detection and/or diagnosis of SARS-CoV-2 by FDA under an Emergency Use Authorization (EUA).  This EUA will remain in effect (meaning this test c an be used) for the duration of  the COVID-19 declaration under Section 564(b)(1) of the Act, 21 U.S.C. section 360-bbb-3(b)(1), unless the authorization is terminated or revoked sooner.  Performed at [facility], [address], [place], [place] [address]       Radiology Studies: DG Chest 2 View  Result Date: 09/15/2020 CLINICAL DATA:  History of COVID pneumonia.  Former  smoker. EXAM: CHEST - 2 VIEW COMPARISON:  None. FINDINGS: Normal cardiac silhouette and mediastinal contours. Extensive bilateral peripheral and basilar predominant nodular interstitial and airspace opacities with relative sparing of the right costophrenic angle. No definite pleural effusion or pneumothorax. No evidence of edema. No acute osseous abnormalities. Mild-to-moderate focal scoliotic curvature of the thoracolumbar spine with dominant mid component convex to the right. IMPRESSION: Rather extensive bilateral mid and lower lung peripheral predominant interstitial and nodular airspace opacities compatible with provided history of COVID pneumonia. Electronically Signed   By: Simonne Come M.D.   On: 09/15/2020 11:16     Scheduled Meds: . vitamin C  500 mg Oral Daily  . aspirin  81 mg Oral Daily  . atorvastatin  80 mg Oral Daily  . enoxaparin (LOVENOX) injection  40 mg Subcutaneous Q24H  . [START ON 09/17/2020] influenza vac  split quadrivalent PF  0.5 mL Intramuscular Tomorrow-1000  . insulin aspart  0-15 Units Subcutaneous TID WC  . insulin aspart  0-5 Units Subcutaneous QHS  . melatonin  5 mg Oral QHS  . methylPREDNISolone (SOLU-MEDROL) injection  60 mg Intravenous BID  . metoprolol tartrate  25 mg Oral BID  . sodium chloride flush  3 mL Intravenous Q12H  . zinc sulfate  220 mg Oral Daily   Continuous Infusions: . remdesivir 100 mg in NS 100 mL 100 mg (09/16/20 1000)     LOS: 1 day     Darlin Priestly, MD [facility] If 7PM-7AM, please contact night-coverage 09/16/2020, 3:54 PM

## 2020-09-17 LAB — BASIC METABOLIC PANEL
Anion gap: 10 (ref 5–15)
BUN: 32 mg/dL — ABNORMAL HIGH (ref 8–23)
CO2: 23 mmol/L (ref 22–32)
Calcium: 8.7 mg/dL — ABNORMAL LOW (ref 8.9–10.3)
Chloride: 103 mmol/L (ref 98–111)
Creatinine, Ser: 1.12 mg/dL (ref 0.61–1.24)
GFR calc Af Amer: >60 mL/min (ref >60)
GFR calc non Af Amer: >60 mL/min (ref >60)
Glucose, Bld: 187 mg/dL — ABNORMAL HIGH (ref 70–99)
Potassium: 4.1 mmol/L (ref 3.5–5.1)
Sodium: 136 mmol/L (ref 135–145)

## 2020-09-17 LAB — GLUCOSE, CAPILLARY
Glucose-Capillary: 155 mg/dL — ABNORMAL HIGH (ref 70–99)
Glucose-Capillary: 184 mg/dL — ABNORMAL HIGH (ref 70–99)
Glucose-Capillary: 244 mg/dL — ABNORMAL HIGH (ref 70–99)
Glucose-Capillary: 235 mg/dL — ABNORMAL HIGH (ref 70–99)

## 2020-09-17 LAB — CBC
HCT: 40.1 % (ref 39.0–52.0)
Hemoglobin: 13.7 g/dL (ref 13.0–17.0)
MCH: 31 pg (ref 26.0–34.0)
MCHC: 34.2 g/dL (ref 30.0–36.0)
MCV: 90.7 fL (ref 80.0–100.0)
Platelets: 194 10*3/uL (ref 150–400)
RBC: 4.42 MIL/uL (ref 4.22–5.81)
RDW: 14 % (ref 11.5–15.5)
WBC: 8 10*3/uL (ref 4.0–10.5)
nRBC: 0 % (ref 0.0–0.2)

## 2020-09-17 LAB — MAGNESIUM: Magnesium: 2.3 mg/dL (ref 1.7–2.4)

## 2020-09-17 LAB — C-REACTIVE PROTEIN: CRP: 7.6 mg/dL — ABNORMAL HIGH (ref <1.0)

## 2020-09-17 LAB — FIBRIN DERIVATIVES D-DIMER (ARMC ONLY): Fibrin derivatives D-dimer (ARMC): 659.34 ng/mL (FEU) — ABNORMAL HIGH (ref 0.00–499.00)

## 2020-09-17 MED ORDER — INFLUENZA VAC SPLIT QUAD 0.5 ML IM SUSY
0.5000 mL | PREFILLED_SYRINGE | INTRAMUSCULAR | Status: DC | PRN
  Filled 2020-09-17: qty 0.5

## 2020-09-17 MED ORDER — HYDROXYZINE HCL 25 MG PO TABS
25.0000 mg | ORAL_TABLET | Freq: Every day | ORAL | Status: DC
  Administered 2020-09-17 – 2020-09-18 (×2): 25 mg via ORAL
  Filled 2020-09-17 (×3): qty 1

## 2020-09-17 MED ORDER — POLYETHYLENE GLYCOL 3350 17 G PO PACK
34.0000 g | PACK | Freq: Two times a day (BID) | ORAL | Status: DC
  Administered 2020-09-17 (×2): 34 g via ORAL
  Filled 2020-09-17 (×4): qty 2

## 2020-09-17 MED ORDER — LIVING WELL WITH DIABETES BOOK - IN SPANISH
Freq: Once | Status: AC
  Filled 2020-09-17 (×2): qty 1

## 2020-09-17 NOTE — TOC Initial Note (Signed)
Transition of Care [facility]) - Initial/Assessment Note    Patient Details  Name: Andrew Palmer MRN: [id number] Date of Birth: 10-02-1956  Transition [facility]) CM/SW Contact:    Allayne Butcher, RN Phone Number: 09/17/2020, 3:02 PM  Clinical Narrative:                 Patient admitted to the hospital with COVID currently not requiring any supplemental oxygen.  RNCM was able to speak with patient using the Spanish Interpreter for translation.  Patient reports that he lives alone in [place] where he rents a room from the owner of the house he lives in.  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  Patient is independent and requires no assistive devices.  Patient drives and has reliable transportation.  Patient does not have a PCP and asks that the [facility] team reach out to his niece with information on local clinics.  RNCM attempted to call Byrd Hesselbach, the niece and left a message for a return call.  RNCM will provide [facility] resources and Product manager.   Patient reports that [facility] is the closest pharmacy to him.   TOC team will cont to follow.   Expected Discharge Plan: Home/Self Care Barriers to Discharge: Continued Medical Work up   Patient Goals and CMS Choice Patient states their goals for this hospitalization and ongoing recovery are:: To never have to come back   Choice offered to / list presented to : NA  Expected Discharge Plan and Services Expected Discharge Plan: Home/Self Care   Discharge Planning Services: CM Consult   Living arrangements for the past 2 months: Single Family Home (rents a room)                   DME Agency: NA       HH Arranged: NA          Prior Living Arrangements/Services Living arrangements for the past 2 months: Single Family Home (rents a room) Lives with:: Self Patient language and need for interpreter reviewed:: Yes (Spanish  Interpreter needed) Do you feel safe going back to the place where you live?: Yes      Need for Family Participation in Patient Care: Yes (Comment) (COVID) Care giver support system in place?: Yes (comment) (niece)   Criminal Activity/Legal Involvement Pertinent to Current Situation/Hospitalization: No - Comment as needed  Activities of Daily Living Home Assistive Devices/Equipment: None ADL Screening (condition at time of admission) Patient's cognitive ability adequate to safely complete daily activities?: Yes Is the patient deaf or have difficulty hearing?: No Does the patient have difficulty seeing, even when wearing glasses/contacts?: No Does the patient have difficulty concentrating, remembering, or making decisions?: No Patient able to express need for assistance with ADLs?: Yes Does the patient have difficulty dressing or bathing?: No Independently performs ADLs?: Yes (appropriate for developmental age) Does the patient have difficulty walking or climbing stairs?: No Weakness of Legs: None Weakness of Arms/Hands: None  Permission Sought/Granted Permission sought to share information with : Case Manager, Family Supports Permission granted to share information with : Yes, Verbal Permission Granted  Share Information with NAME: Byrd Hesselbach     Permission granted to share info w Relationship: niece  Permission granted to share info w Contact Information: [phone]  Emotional Assessment   Attitude/Demeanor/Rapport: Engaged Affect (typically observed): Accepting, Pleasant Orientation: : Oriented to Self, Oriented to Place, Oriented to  Time,  Oriented to Situation Alcohol / Substance Use: Not Applicable Psych Involvement: No (comment)  Admission diagnosis:  Acute hypoxemic respiratory failure due to COVID-19 (HCC) [U07.1, J96.01] Pneumonia due to COVID-19 virus [U07.1, J12.82] Patient Active Problem List   Diagnosis Date Noted  . Pneumonia due to COVID-19 virus 09/15/2020  .  CAD S/P percutaneous coronary angioplasty 12/07/2016    Class: Diagnosis of  . Hyperlipidemia with target low density lipoprotein (LDL) cholesterol less than 70 mg/dL 79/01/4096  . ST elevation (STEMI) myocardial infarction involving right coronary artery (HCC) 12/06/2016  . Essential hypertension 12/06/2016  . PAF (paroxysmal atrial fibrillation) (HCC) 12/06/2016   PCP:  Patient, No Pcp Per Pharmacy:   [facility] [address] [place] - [address] [place] [address] Phone: [phone] Fax: [phone]     Social Determinants of Health (SDOH) Interventions    Readmission Risk Interventions No flowsheet data found.

## 2020-09-17 NOTE — Progress Notes (Addendum)
Inpatient Diabetes Program Recommendations  AACE/ADA: New Consensus Statement on Inpatient Glycemic Control (2015)  Target Ranges:  Prepandial:   less than 140 mg/dL      Peak postprandial:   less than 180 mg/dL (1-2 hours)      Critically ill patients:  140 - 180 mg/dL   Lab Results  Component Value Date   GLUCAP 155 (H) 09/17/2020   HGBA1C 6.8 (H) 09/16/2020    Review of Glycemic Control  Diabetes history: None  Current orders for Inpatient glycemic control:  Novolog 0-15 units tid + hs  Solumedrol 60 mg Q12 hours  A1c 6.8% on 9/20 Glucose 163 on presentation No PCP No Insurance   Will need at least a glucose meter at time of d/c and close PCP follow up. Possibly need glipizide Daily while on steroids.  MD please discuss with pt regarding A1c level and follow up. Will reinforce information. Placed TOC consult for potential glucose meter and PCP assistance for follow up.   Pt directions to discuss when I call:  Get another Hemoglobin A1c level in 3-4 months after steroid completion to get another baseline reading of your glucose levels without having a virus or medications that can spike your glucose levels.  Exercise when medically able 30 minutes 5 days a week  Watch portion sizes of carbohydrates (breads, pastas, potatoes, corn and other starchy foods, also with fruit)  Drink diet, zero, light versions of beverages nothing regular with sugar in it.  Addendum 1000 am:  Spoke with pt over the phone regarding lifestyle modifications, diet and exercise in detail. Discussed follow up with PCP. Pt reports his brother had Diabetes, however he is no longer living. Pt requesting medication for sleep when he goes home.    Thanks,  Christena Deem RN, MSN, BC-ADM Inpatient Diabetes Coordinator Team Pager [phone] (8a-5p)

## 2020-09-17 NOTE — Progress Notes (Addendum)
PROGRESS NOTE    Andrew Palmer  KDX:[id number] DOB: Jun 25, 1956 DOA: 09/15/2020 PCP: Patient, No Pcp Per    Assessment & Plan:   Principal Problem:   Pneumonia due to COVID-19 virus Active Problems:   Essential hypertension   PAF (paroxysmal atrial fibrillation) (HCC)   CAD S/P percutaneous coronary angioplasty   Hyperlipidemia with target low density lipoprotein (LDL) cholesterol less than 70 mg/dL    Andrew Palmer is a Spanish-speaking  64 y.o. male with medical history significant for CAD with inferior STEMI s/p PCI with DES to mid RCA (12/06/2016), paroxysmal atrial fibrillation (at time of STEMI, not on anticoagulation), hypertension, and hyperlipidemia who presents to the ED for evaluation of progressive shortness of breath in setting of reported positive Covid test 1 week ago.   COVID-19 pneumonia Reported positive Covid test 1 week prior to admission, not on our file.  SARS-CoV-2 PCR + 09/15/2020 here.  CXR with bilateral opacities consistent with Covid pneumonia.   --CRP 13.6 --Noted desat down to 83% on RA today.  Back on 2L [place]. PLAN: -Continue IV remdesivir per protocol --continue solumedrol 60 mg BID -Continue supple oxygen as needed and wean as able -Continue flutter valve, incentive spirometer, albuterol as needed -Antitussives, vitamin C, zinc  CAD/Hx of STEMI s/p PCI with DES to mid RCA 12/06/2016: Chronic and stable.  Denies any current chest pain.  He states he is not currently taking any medications. --pt restarted on ASA 81, lipitor 80 mg daily and Lopressor 25 mg BID on admission. PLAN: --continue current regimen as above.  Paroxysmal atrial fibrillation: Noted only at time of his STEMI therefore not on chronic anticoagulation.   --pt restarted on Lopressor 25 mg BID on admission --currently rate controlled PLAN: --continue lopressor  Hypertension: --pt restarted on Lopressor 25 mg BID on admission --BP wnl PLAN: --continue  lopressor  Hyperlipidemia: --pt restarted on lipitor on presentation. --continue Lipitor  DM2 Hyperglycemia from steroid use --A1c 6.8.  Not on diabetic meds at home PLAN: --currently A1c is at goal.  Will encourage life-style modification and diet to control diabetes.   --No need for regular BG checks here or at home. --Need outpatient followup, TOC consulted to help pt establish with PCP   DVT prophylaxis: Lovenox SQ Code Status: Full code  Family Communication:  Status is: inpatient Dispo:   The patient is from: home Anticipated d/c is to: home Anticipated d/c date is: 1-2 days Patient currently is not medically stable to d/c due to: COVID on Remdesivir, still needs 2L O2   Subjective and Interval History:  Pt complained of constipation and not being able to sleep at night.  Denied pain.    Noted desat down to 83% on RA.  Back on 2L [place].   Objective: Vitals:   09/17/20 0014 09/17/20 0443 09/17/20 0728 09/17/20 1137  BP: 118/78 114/79 127/88 135/87  Pulse: (!) 57 66 67 61  Resp: 17 18 (!) 21 14  Temp: 98.1 F (36.7 C) 98.3 F (36.8 C) 98.3 F (36.8 C) 98.2 F (36.8 C)  TempSrc:   Oral Oral  SpO2: (!) 89% 94% (!) 89% (!) 87%  Weight:      Height:        Intake/Output Summary (Last 24 hours) at 09/17/2020 1514 Last data filed at 09/16/2020 2203 Gross per 24 hour  Intake 580 ml  Output --  Net 580 ml   Filed Weights   09/15/20 1012 09/15/20 2011  Weight: 104.3 kg 103.8 kg  Examination:   Constitutional: NAD, AAOx3 HEENT: conjunctivae and lids normal, EOMI CV: RRR no M,R,G. Distal pulses +2.  No cyanosis.   RESP: normal respiratory effort, on RA GI: +BS, NTND Extremities: No effusions, edema in BLE SKIN: warm, dry and intact Neuro: II - XII grossly intact.  Sensation intact Psych: Normal mood and affect.      Data Reviewed: I have personally reviewed following labs and imaging studies  CBC: Recent Labs  Lab 09/15/20 1017 09/16/20 0356  09/17/20 0619  WBC 3.9* 3.1* 8.0  NEUTROABS  --  2.5  --   HGB 14.1 14.2 13.7  HCT 40.8 41.2 40.1  MCV 88.5 89.2 90.7  PLT 144* 164 194   Basic Metabolic Panel: Recent Labs  Lab 09/15/20 1017 09/16/20 0356 09/17/20 0619  NA 134* 134* 136  K 4.3 4.0 4.1  CL 100 100 103  CO2 24 25 23   GLUCOSE 163* 299* 187*  BUN 16 28* 32*  CREATININE 1.14 1.18 1.12  CALCIUM 8.5* 8.7* 8.7*  MG  --  2.2 2.3  PHOS  --  4.2  --    GFR: Estimated Creatinine Clearance: 78.9 mL/min (by C-G formula based on SCr of 1.12 mg/dL). Liver Function Tests: Recent Labs  Lab 09/15/20 1017 09/16/20 0356  AST 52* 45*  ALT 23 28  ALKPHOS 35* 32*  BILITOT 1.2 0.6  PROT 8.0 7.6  ALBUMIN 3.7 3.2*   No results for input(s): LIPASE, AMYLASE in the last 168 hours. No results for input(s): AMMONIA in the last 168 hours. Coagulation Profile: No results for input(s): INR, PROTIME in the last 168 hours. Cardiac Enzymes: No results for input(s): CKTOTAL, CKMB, CKMBINDEX, TROPONINI in the last 168 hours. BNP (last 3 results) No results for input(s): PROBNP in the last 8760 hours. HbA1C: Recent Labs    09/16/20 0356  HGBA1C 6.8*   CBG: Recent Labs  Lab 09/16/20 1645 09/16/20 2120 09/17/20 0809 09/17/20 1209  GLUCAP 178* 288* 155* 184*   Lipid Profile: No results for input(s): CHOL, HDL, LDLCALC, TRIG, CHOLHDL, LDLDIRECT in the last 72 hours. Thyroid Function Tests: No results for input(s): TSH, T4TOTAL, FREET4, T3FREE, THYROIDAB in the last 72 hours. Anemia Panel: Recent Labs    09/16/20 0356  FERRITIN 809*   Sepsis Labs: Recent Labs  Lab 09/15/20 1017 09/15/20 1554  PROCALCITON 0.30  --   LATICACIDVEN  --  1.4    Recent Results (from the past 240 hour(s))  SARS Coronavirus 2 by RT PCR (hospital order, performed in [facility] hospital lab) Nasopharyngeal Nasopharyngeal Swab     Status: Abnormal   Collection Time: 09/15/20  4:49 PM   Specimen: Nasopharyngeal Swab  Result Value Ref  Range Status   SARS Coronavirus 2 POSITIVE (A) NEGATIVE Final    Comment: RESULT CALLED TO, READ BACK BY AND VERIFIED WITH: HUNTER ORE AT 1741 09/15/20.PMF (NOTE) SARS-CoV-2 target nucleic acids are DETECTED  SARS-CoV-2 RNA is generally detectable in upper respiratory specimens  during the acute phase of infection.  Positive results are indicative  of the presence of the identified virus, but do not rule out bacterial infection or co-infection with other pathogens not detected by the test.  Clinical correlation with patient history and  other diagnostic information is necessary to determine patient infection status.  The expected result is negative.  Fact Sheet for Patients:   09/17/20   Fact Sheet for Healthcare Providers:   BoilerBrush.com.cy    This test is not yet approved or  cleared by the [place] and  has been authorized for detection and/or diagnosis of SARS-CoV-2 by FDA under an Emergency Use Authorization (EUA).  This EUA will remain in effect (meaning this test c an be used) for the duration of  the COVID-19 declaration under Section 564(b)(1) of the Act, 21 U.S.C. section 360-bbb-3(b)(1), unless the authorization is terminated or revoked sooner.  Performed at [facility], [address] [place], [place] [address]       Radiology Studies: No results found.   Scheduled Meds: . vitamin C  500 mg Oral Daily  . aspirin  81 mg Oral Daily  . atorvastatin  80 mg Oral Daily  . enoxaparin (LOVENOX) injection  40 mg Subcutaneous Q24H  . hydrOXYzine  25 mg Oral QHS  . insulin aspart  0-15 Units Subcutaneous TID WC  . insulin aspart  0-5 Units Subcutaneous QHS  . melatonin  5 mg Oral QHS  . methylPREDNISolone (SOLU-MEDROL) injection  60 mg Intravenous BID  . metoprolol tartrate  25 mg Oral BID  . polyethylene glycol  34 g Oral BID  . sodium chloride flush  3 mL Intravenous Q12H  . zinc  sulfate  220 mg Oral Daily   Continuous Infusions: . remdesivir 100 mg in NS 100 mL 100 mg (09/17/20 0909)     LOS: 2 days     Darlin Priestly, MD [facility] If 7PM-7AM, please contact night-coverage 09/17/2020, 3:14 PM

## 2020-09-18 ENCOUNTER — Telehealth: Payer: Self-pay | Admitting: General Practice

## 2020-09-18 ENCOUNTER — Encounter: Payer: Self-pay | Admitting: Internal Medicine

## 2020-09-18 LAB — C-REACTIVE PROTEIN: CRP: 4.5 mg/dL — ABNORMAL HIGH (ref <1.0)

## 2020-09-18 LAB — CBC
HCT: 41.1 % (ref 39.0–52.0)
Hemoglobin: 14.3 g/dL (ref 13.0–17.0)
MCH: 30.6 pg (ref 26.0–34.0)
MCHC: 34.8 g/dL (ref 30.0–36.0)
MCV: 87.8 fL (ref 80.0–100.0)
Platelets: 246 10*3/uL (ref 150–400)
RBC: 4.68 MIL/uL (ref 4.22–5.81)
RDW: 14 % (ref 11.5–15.5)
WBC: 9.4 10*3/uL (ref 4.0–10.5)
nRBC: 0 % (ref 0.0–0.2)

## 2020-09-18 LAB — GLUCOSE, CAPILLARY
Glucose-Capillary: 178 mg/dL — ABNORMAL HIGH (ref 70–99)
Glucose-Capillary: 233 mg/dL — ABNORMAL HIGH (ref 70–99)
Glucose-Capillary: 266 mg/dL — ABNORMAL HIGH (ref 70–99)
Glucose-Capillary: 242 mg/dL — ABNORMAL HIGH (ref 70–99)

## 2020-09-18 LAB — BASIC METABOLIC PANEL
Anion gap: 13 (ref 5–15)
BUN: 29 mg/dL — ABNORMAL HIGH (ref 8–23)
CO2: 25 mmol/L (ref 22–32)
Calcium: 8.7 mg/dL — ABNORMAL LOW (ref 8.9–10.3)
Chloride: 101 mmol/L (ref 98–111)
Creatinine, Ser: 1 mg/dL (ref 0.61–1.24)
GFR calc Af Amer: >60 mL/min (ref >60)
GFR calc non Af Amer: >60 mL/min (ref >60)
Glucose, Bld: 187 mg/dL — ABNORMAL HIGH (ref 70–99)
Potassium: 4.5 mmol/L (ref 3.5–5.1)
Sodium: 139 mmol/L (ref 135–145)

## 2020-09-18 LAB — FIBRIN DERIVATIVES D-DIMER (ARMC ONLY): Fibrin derivatives D-dimer (ARMC): 614.51 ng/mL (FEU) — ABNORMAL HIGH (ref 0.00–499.00)

## 2020-09-18 LAB — MAGNESIUM: Magnesium: 2.2 mg/dL (ref 1.7–2.4)

## 2020-09-18 MED ORDER — METHYLPREDNISOLONE SODIUM SUCC 125 MG IJ SOLR
60.0000 mg | Freq: Every day | INTRAMUSCULAR | Status: DC
  Administered 2020-09-18 – 2020-09-19 (×2): 60 mg via INTRAVENOUS
  Filled 2020-09-18 (×2): qty 2

## 2020-09-18 NOTE — Progress Notes (Signed)
PROGRESS NOTE    Andrew Palmer  XFG:[id number] DOB: 1956-11-28 DOA: 09/15/2020 PCP: Patient, No Pcp Per    Assessment & Plan:   Principal Problem:   Pneumonia due to COVID-19 virus Active Problems:   Essential hypertension   PAF (paroxysmal atrial fibrillation) (HCC)   CAD S/P percutaneous coronary angioplasty   Hyperlipidemia with target low density lipoprotein (LDL) cholesterol less than 70 mg/dL    Andrew Palmer is a Spanish-speaking  64 y.o. male with medical history significant for CAD with inferior STEMI s/p PCI with DES to mid RCA (12/06/2016), paroxysmal atrial fibrillation (at time of STEMI, not on anticoagulation), hypertension, and hyperlipidemia who presents to the ED for evaluation of progressive shortness of breath in setting of reported positive Covid test 1 week ago.   Acute hypoxic respiratory failure 2/2 COVID-19 pneumonia Reported positive Covid test 1 week prior to admission, not on our file.  SARS-CoV-2 PCR + 09/15/2020 here.  CXR with bilateral opacities consistent with Covid pneumonia.   --CRP 13.6, trending down with steroid --Noted desat down to 83% on RA, needed 2L O2.   PLAN: -Continue IV remdesivir per protocol --taper solumedrol to 60 mg daily -Continue supple oxygen as needed and wean as able -Continue flutter valve, incentive spirometer, albuterol as needed -Antitussives, vitamin C, zinc  CAD/Hx of STEMI s/p PCI with DES to mid RCA 12/06/2016: Chronic and stable.  Denies any current chest pain.  He states he is not currently taking any medications. --pt restarted on ASA 81, lipitor 80 mg daily and Lopressor 25 mg BID on admission. PLAN: --continue current regimen as above  Paroxysmal atrial fibrillation: Noted only at time of his STEMI therefore not on chronic anticoagulation.   --pt restarted on Lopressor 25 mg BID on admission --currently rate controlled PLAN: --continue lopressor  Hypertension: --pt restarted on  Lopressor 25 mg BID on admission --BP 110's-140's PLAN: --continue lopressor  Hyperlipidemia: --pt restarted on lipitor on presentation. --continue Lipitor  DM2 Hyperglycemia from steroid use --A1c 6.8.  Not on diabetic meds at home PLAN: --currently A1c is at goal.  Will encourage life-style modification and diet to control diabetes.   --No need for regular BG checks here or at home. --Need outpatient followup, TOC consulted to help pt establish with PCP   DVT prophylaxis: Lovenox SQ Code Status: Full code  Family Communication:  Status is: inpatient Dispo:   The patient is from: home Anticipated d/c is to: home Anticipated d/c date is: 1-2 days Patient currently is not medically stable to d/c due to: COVID on Remdesivir, still needs 2L O2   Subjective and Interval History:  Constipation resolved and pt was able to sleep better with nightly Atarax.  O2 requirement however increased to 2L.   Objective: Vitals:   09/18/20 0008 09/18/20 0800 09/18/20 1026 09/18/20 1108  BP: 135/89 (!) 129/91  (!) 141/75  Pulse: 63 63  (!) 57  Resp: 18 15  18   Temp: (!) 97.5 F (36.4 C) 98.6 F (37 C)  97.9 F (36.6 C)  TempSrc: Oral Oral  Oral  SpO2: 91% 92% 95% (!) 89%  Weight:      Height:        Intake/Output Summary (Last 24 hours) at 09/18/2020 1420 Last data filed at 09/17/2020 2135 Gross per 24 hour  Intake 240 ml  Output --  Net 240 ml   Filed Weights   09/15/20 1012 09/15/20 2011  Weight: 104.3 kg 103.8 kg    Examination:  Constitutional: NAD, AAOx3 HEENT: conjunctivae and lids normal, EOMI CV: RRR no M,R,G. Distal pulses +2.  No cyanosis.   RESP: normal respiratory effort, on 2L GI: +BS, NTND Extremities: No effusions, edema in BLE SKIN: warm, dry and intact Neuro: II - XII grossly intact.  Sensation intact Psych: Normal mood and affect.  Appropriate judgement and reason    Data Reviewed: I have personally reviewed following labs and imaging  studies  CBC: Recent Labs  Lab 09/15/20 1017 09/16/20 0356 09/17/20 0619 09/18/20 0601  WBC 3.9* 3.1* 8.0 9.4  NEUTROABS  --  2.5  --   --   HGB 14.1 14.2 13.7 14.3  HCT 40.8 41.2 40.1 41.1  MCV 88.5 89.2 90.7 87.8  PLT 144* 164 194 246   Basic Metabolic Panel: Recent Labs  Lab 09/15/20 1017 09/16/20 0356 09/17/20 0619 09/18/20 0601  NA 134* 134* 136 139  K 4.3 4.0 4.1 4.5  CL 100 100 103 101  CO2 24 25 23 25   GLUCOSE 163* 299* 187* 187*  BUN 16 28* 32* 29*  CREATININE 1.14 1.18 1.12 1.00  CALCIUM 8.5* 8.7* 8.7* 8.7*  MG  --  2.2 2.3 2.2  PHOS  --  4.2  --   --    GFR: Estimated Creatinine Clearance: 88.3 mL/min (by C-G formula based on SCr of 1 mg/dL). Liver Function Tests: Recent Labs  Lab 09/15/20 1017 09/16/20 0356  AST 52* 45*  ALT 23 28  ALKPHOS 35* 32*  BILITOT 1.2 0.6  PROT 8.0 7.6  ALBUMIN 3.7 3.2*   No results for input(s): LIPASE, AMYLASE in the last 168 hours. No results for input(s): AMMONIA in the last 168 hours. Coagulation Profile: No results for input(s): INR, PROTIME in the last 168 hours. Cardiac Enzymes: No results for input(s): CKTOTAL, CKMB, CKMBINDEX, TROPONINI in the last 168 hours. BNP (last 3 results) No results for input(s): PROBNP in the last 8760 hours. HbA1C: Recent Labs    09/16/20 0356  HGBA1C 6.8*   CBG: Recent Labs  Lab 09/17/20 1209 09/17/20 1544 09/17/20 2116 09/18/20 0802 09/18/20 1113  GLUCAP 184* 244* 235* 178* 233*   Lipid Profile: No results for input(s): CHOL, HDL, LDLCALC, TRIG, CHOLHDL, LDLDIRECT in the last 72 hours. Thyroid Function Tests: No results for input(s): TSH, T4TOTAL, FREET4, T3FREE, THYROIDAB in the last 72 hours. Anemia Panel: Recent Labs    09/16/20 0356  FERRITIN 809*   Sepsis Labs: Recent Labs  Lab 09/15/20 1017 09/15/20 1554  PROCALCITON 0.30  --   LATICACIDVEN  --  1.4    Recent Results (from the past 240 hour(s))  SARS Coronavirus 2 by RT PCR (hospital order,  performed in [facility] hospital lab) Nasopharyngeal Nasopharyngeal Swab     Status: Abnormal   Collection Time: 09/15/20  4:49 PM   Specimen: Nasopharyngeal Swab  Result Value Ref Range Status   SARS Coronavirus 2 POSITIVE (A) NEGATIVE Final    Comment: RESULT CALLED TO, READ BACK BY AND VERIFIED WITH: HUNTER ORE AT 1741 09/15/20.PMF (NOTE) SARS-CoV-2 target nucleic acids are DETECTED  SARS-CoV-2 RNA is generally detectable in upper respiratory specimens  during the acute phase of infection.  Positive results are indicative  of the presence of the identified virus, but do not rule out bacterial infection or co-infection with other pathogens not detected by the test.  Clinical correlation with patient history and  other diagnostic information is necessary to determine patient infection status.  The expected result is negative.  Fact Sheet for Patients:   BoilerBrush.com.cy   Fact Sheet for Healthcare Providers:   https://pope.com/    This test is not yet approved or cleared by the [place] FDA and  has been authorized for detection and/or diagnosis of SARS-CoV-2 by FDA under an Emergency Use Authorization (EUA).  This EUA will remain in effect (meaning this test c an be used) for the duration of  the COVID-19 declaration under Section 564(b)(1) of the Act, 21 U.S.C. section 360-bbb-3(b)(1), unless the authorization is terminated or revoked sooner.  Performed at [facility], [address] [place], [place] [address]       Radiology Studies: No results found.   Scheduled Meds: . vitamin C  500 mg Oral Daily  . aspirin  81 mg Oral Daily  . atorvastatin  80 mg Oral Daily  . enoxaparin (LOVENOX) injection  40 mg Subcutaneous Q24H  . hydrOXYzine  25 mg Oral QHS  . insulin aspart  0-15 Units Subcutaneous TID WC  . insulin aspart  0-5 Units Subcutaneous QHS  . melatonin  5 mg Oral QHS  . methylPREDNISolone  (SOLU-MEDROL) injection  60 mg Intravenous Daily  . metoprolol tartrate  25 mg Oral BID  . polyethylene glycol  34 g Oral BID  . sodium chloride flush  3 mL Intravenous Q12H  . zinc sulfate  220 mg Oral Daily   Continuous Infusions: . remdesivir 100 mg in NS 100 mL 100 mg (09/18/20 0959)     LOS: 3 days     Darlin Priestly, MD [facility] If 7PM-7AM, please contact night-coverage 09/18/2020, 2:20 PM

## 2020-09-18 NOTE — TOC Progression Note (Signed)
Transition of Care [facility]) - Progression Note    Patient Details  Name: Andrew Palmer MRN: [id number] Date of Birth: 12/06/1956  [facility]) CM/SW Contact  Allayne Butcher, RN Phone Number: 09/18/2020, 9:00 AM  Clinical Narrative:    RNCM spoke with patient's niece, Byrd Hesselbach yesterday afternoon and she requested that I [facility] to her.  Application emailed to [place] at [email or web address].  [facility]. TOC will cont to follow.    Expected Discharge Plan: Home/Self Care Barriers to Discharge: Continued Medical Work up  Expected Discharge Plan and Services Expected Discharge Plan: Home/Self Care   Discharge Planning Services: CM Consult   Living arrangements for the past 2 months: Single Family Home (rents a room)                   DME Agency: NA       HH Arranged: NA           Social Determinants of Health (SDOH) Interventions    Readmission Risk Interventions No flowsheet data found.

## 2020-09-18 NOTE — Telephone Encounter (Signed)
Spoke to pt's niece, Byrd Hesselbach, about becoming a pt and eligibility requirements. She will come pick up a packet. MD9/22@3 :57.

## 2020-09-18 NOTE — Progress Notes (Signed)
HS dose of Atarax was effective; pt rested well.

## 2020-09-18 NOTE — Progress Notes (Signed)
SATURATION QUALIFICATIONS: (This note is used to comply with regulatory documentation for home oxygen)  Patient Saturations on Room Air at Rest = 89%  Patient Saturations on Room Air while Ambulating = 85%  Patient Saturations on 2 Liters of oxygen while Ambulating = 93%  Please briefly explain why patient needs home oxygen:  Patient able to ambulate independently.  He does report that he get SOB when ambulating at times but denies any dizziness or weakness.

## 2020-09-18 NOTE — TOC Progression Note (Signed)
Transition of Care [facility]) - Progression Note    Patient Details  Name: Andrew Palmer MRN: [id number] Date of Birth: [birth year]  [facility]) CM/SW Contact  Allayne Butcher, RN Phone Number: 09/18/2020, 5:28 PM  Clinical Narrative:    Plan for discharge home tomorrow.  Patient will need home O2 at 2L at discharge.  Letter of guarantee requested for home O2.   Oletha Cruel given referral and aware that LOG has been submitted.  Patient will also need a pulse oximeter to go home with.     Expected Discharge Plan: Home/Self Care Barriers to Discharge: Continued Medical Work up  Expected Discharge Plan and Services Expected Discharge Plan: Home/Self Care   Discharge Planning Services: CM Consult   Living arrangements for the past 2 months: Single Family Home (rents a room)                 DME Arranged: Oxygen DME Agency: [employer] Date DME Agency Contacted: 09/18/20 Time DME Agency Contacted: 1728 Representative spoke with at DME Agency: Oletha Cruel HH Arranged: NA           Social Determinants of Health (SDOH) Interventions    Readmission Risk Interventions No flowsheet data found.

## 2020-09-19 LAB — CBC
HCT: 41.8 % (ref 39.0–52.0)
Hemoglobin: 14.6 g/dL (ref 13.0–17.0)
MCH: 30.7 pg (ref 26.0–34.0)
MCHC: 34.9 g/dL (ref 30.0–36.0)
MCV: 88 fL (ref 80.0–100.0)
Platelets: 262 10*3/uL (ref 150–400)
RBC: 4.75 MIL/uL (ref 4.22–5.81)
RDW: 14.1 % (ref 11.5–15.5)
WBC: 10.8 10*3/uL — ABNORMAL HIGH (ref 4.0–10.5)
nRBC: 0 % (ref 0.0–0.2)

## 2020-09-19 LAB — BASIC METABOLIC PANEL
Anion gap: 11 (ref 5–15)
BUN: 30 mg/dL — ABNORMAL HIGH (ref 8–23)
CO2: 26 mmol/L (ref 22–32)
Calcium: 8.5 mg/dL — ABNORMAL LOW (ref 8.9–10.3)
Chloride: 99 mmol/L (ref 98–111)
Creatinine, Ser: 1.19 mg/dL (ref 0.61–1.24)
GFR calc Af Amer: >60 mL/min (ref >60)
GFR calc non Af Amer: >60 mL/min (ref >60)
Glucose, Bld: 141 mg/dL — ABNORMAL HIGH (ref 70–99)
Potassium: 4 mmol/L (ref 3.5–5.1)
Sodium: 136 mmol/L (ref 135–145)

## 2020-09-19 LAB — MAGNESIUM: Magnesium: 2.2 mg/dL (ref 1.7–2.4)

## 2020-09-19 LAB — GLUCOSE, CAPILLARY
Glucose-Capillary: 144 mg/dL — ABNORMAL HIGH (ref 70–99)
Glucose-Capillary: 144 mg/dL — ABNORMAL HIGH (ref 70–99)
Glucose-Capillary: 260 mg/dL — ABNORMAL HIGH (ref 70–99)

## 2020-09-19 LAB — C-REACTIVE PROTEIN: CRP: 3.5 mg/dL — ABNORMAL HIGH (ref <1.0)

## 2020-09-19 MED ORDER — ASCORBIC ACID 500 MG PO TABS: 500.0000 mg | ORAL_TABLET | Freq: Every day | ORAL | 0 refills | Status: AC

## 2020-09-19 MED ORDER — ZINC SULFATE 220 (50 ZN) MG PO CAPS: 220.0000 mg | ORAL_CAPSULE | Freq: Every day | ORAL | 0 refills | Status: AC

## 2020-09-19 MED ORDER — DEXAMETHASONE 6 MG PO TABS: 6.0000 mg | ORAL_TABLET | Freq: Every day | ORAL | 0 refills | Status: AC

## 2020-09-19 MED ORDER — POLYETHYLENE GLYCOL 3350 17 G PO PACK: 34.0000 g | PACK | Freq: Two times a day (BID) | ORAL | 0 refills | Status: AC

## 2020-09-19 MED ORDER — INFLUENZA VAC SPLIT QUAD 0.5 ML IM SUSY: 0.5000 mL | PREFILLED_SYRINGE | Freq: Once | INTRAMUSCULAR | 0 refills | Status: AC

## 2020-09-19 NOTE — Discharge Summary (Signed)
Physician Discharge Summary   Andrew Palmer  male DOB: 02/16/1956  KGU:[id number]  PCP: Patient, No Pcp Per  Admit date: 09/15/2020 Discharge date: 09/19/2020  Admitted From: home Disposition:  home Discharge plans discussed with iPad translator  CODE STATUS: Full code  Discharge Instructions    Discharge instructions   Complete by: As directed    You have finished your COVID treatment and doing well, so can go home to continue to recover.    You still need 2 liters of supplemental oxygen when you are walking, so we are sending you home on oxygen.  It's expected that you will not need the oxygen as you recover.  [facility] about a week after discharge for post COVID hospital followup.  Provider there will assess your supplemental oxygen needs.  Please continue to take steroid Decadron as directed for 5 more days to calm inflammation in your body.   Dr. Darlin Priestly [facility] Course:  For full details, please see H&P, progress notes, consult notes and ancillary notes.  Briefly,  Andrew Eddleman Mancinasis a Spanish-speaking64 y.o.malewith medical history significant forCADwithinferior STEMI s/p PCI with DES to mid RCA (12/06/2016), paroxysmal atrial fibrillation (at time ofSTEMI, not on anticoagulation), hypertension, and hyperlipidemia who presented to the ED for evaluation of progressive shortness of breath in setting of reported positive Covid test 1 week ago.   Acute hypoxic respiratory failure 2/2 COVID-19 pneumonia Reported positive Covid test 1 week prior to admission, not on our file. SARS-CoV-2 PCR + 09/15/2020 here. CXR with bilateral opacities consistent with Covid pneumonia. Noted desat down to 83% on RA, needed 2L O2.  Pt completed 5 days of Remdesivir.  CRP 13.6, trending down with steroid, and pt was discharged on 5 more days of Decadron.  Pt needed 2L O2 with walking prior to discharge, so was discharged with  home O2.  Pt will [facility] to monitor his O2 needs.  CAD/Hxof STEMI s/p PCI with DES to mid RCA12/09/2016: Chronic and stable. Denies any current chest pain. He states he is not currently taking any medications. pt restarted on lipitor 80 mg daily and Lopressor 25 mg BID.  Paroxysmal atrial fibrillation: Noted only at time of his STEMI therefore not on chronic anticoagulation. pt restarted on Lopressor 25 mg BID on admission.  Hypertension: pt restarted on Lopressor 25 mg BID on admission.  BP 110's-140's  Hyperlipidemia: pt restarted on lipitor on presentation.  DM2 Hyperglycemia from steroid use A1c 6.8.  Not on diabetic meds at home.  Diabetic educator discussed with pt life-style modification and diet to control diabetes.  No need for regular BG checks here or at home.  Pt set up to follow up with [facility].   Discharge Diagnoses:  Principal Problem:   Pneumonia due to COVID-19 virus Active Problems:   Essential hypertension   PAF (paroxysmal atrial fibrillation) (HCC)   CAD S/P percutaneous coronary angioplasty   Hyperlipidemia with target low density lipoprotein (LDL) cholesterol less than 70 mg/dL    Discharge Instructions:  Allergies as of 09/19/2020   No Known Allergies     Medication List    STOP taking these medications   acetaminophen 500 MG tablet Commonly known as: TYLENOL   aspirin 81 MG chewable tablet   clopidogrel 75 MG tablet Commonly known as: PLAVIX   nitroGLYCERIN 0.4 MG SL tablet Commonly known as: NITROSTAT   pantoprazole  40 MG tablet Commonly known as: PROTONIX   ticagrelor 90 MG Tabs tablet Commonly known as: BRILINTA     TAKE these medications   ascorbic acid 500 MG tablet Commonly known as: VITAMIN C Take 1 tablet (500 mg total) by mouth daily. Start taking on: September 20, 2020   atorvastatin 80 MG tablet Commonly known as: LIPITOR Take 1 tablet (80 mg total) by mouth daily.     dexamethasone 6 MG tablet Commonly known as: DECADRON Take 1 tablet (6 mg total) by mouth daily for 5 days. Steroid to calm inflammation. Start taking on: September 20, 2020   influenza vac split quadrivalent PF 0.5 ML injection Commonly known as: FLUARIX Inject 0.5 mLs into the muscle once for 1 dose.   metoprolol tartrate 25 MG tablet Commonly known as: LOPRESSOR Take 1 tablet (25 mg total) by mouth 2 (two) times daily.   polyethylene glycol 17 g packet Commonly known as: MIRALAX / GLYCOLAX Take 34 g by mouth 2 (two) times daily.   zinc sulfate 220 (50 Zn) MG capsule Take 1 capsule (220 mg total) by mouth daily. Start taking on: September 20, 2020            Durable Medical Equipment  (From admission, onward)         Start     Ordered   09/19/20 0954  For home use only DME oxygen  Once       Question Answer Comment  Length of Need 6 Months   Mode or (Route) Nasal cannula   Liters per Minute 2   Frequency Continuous (stationary and portable oxygen unit needed)   Oxygen delivery system Gas      09/19/20 0953           Follow-up Information    [facility] week.   Specialty: Primary Care Why: post covid hospital followup. Contact information: [address] [place] [id number] [phone]              No Known Allergies   The results of significant diagnostics from this hospitalization (including imaging, microbiology, ancillary and laboratory) are listed below for reference.   Consultations:   Procedures/Studies: DG Chest 2 View  Result Date: 09/15/2020 CLINICAL DATA:  History of COVID pneumonia.  Former smoker. EXAM: CHEST - 2 VIEW COMPARISON:  None. FINDINGS: Normal cardiac silhouette and mediastinal contours. Extensive bilateral peripheral and basilar predominant nodular interstitial and airspace opacities with relative sparing of the right costophrenic angle. No definite pleural  effusion or pneumothorax. No evidence of edema. No acute osseous abnormalities. Mild-to-moderate focal scoliotic curvature of the thoracolumbar spine with dominant mid component convex to the right. IMPRESSION: Rather extensive bilateral mid and lower lung peripheral predominant interstitial and nodular airspace opacities compatible with provided history of COVID pneumonia. Electronically Signed   By: Simonne ComeJohn  Watts M.D.   On: 09/15/2020 11:16      Labs: BNP (last 3 results) Recent Labs    09/15/20 1017  BNP 94.8   Basic Metabolic Panel: Recent Labs  Lab 09/15/20 1017 09/16/20 0356 09/17/20 0619 09/18/20 0601 09/19/20 0612  NA 134* 134* 136 139 136  K 4.3 4.0 4.1 4.5 4.0  CL 100 100 103 101 99  CO2 24 25 23 25 26   GLUCOSE 163* 299* 187* 187* 141*  BUN 16 28* 32* 29* 30*  CREATININE 1.14 1.18 1.12 1.00 1.19  CALCIUM 8.5* 8.7* 8.7* 8.7* 8.5*  MG  --  2.2 2.3 2.2 2.2  PHOS  --  4.2  --   --   --    Liver Function Tests: Recent Labs  Lab 09/15/20 1017 09/16/20 0356  AST 52* 45*  ALT 23 28  ALKPHOS 35* 32*  BILITOT 1.2 0.6  PROT 8.0 7.6  ALBUMIN 3.7 3.2*   No results for input(s): LIPASE, AMYLASE in the last 168 hours. No results for input(s): AMMONIA in the last 168 hours. CBC: Recent Labs  Lab 09/15/20 1017 09/16/20 0356 09/17/20 0619 09/18/20 0601 09/19/20 0612  WBC 3.9* 3.1* 8.0 9.4 10.8*  NEUTROABS  --  2.5  --   --   --   HGB 14.1 14.2 13.7 14.3 14.6  HCT 40.8 41.2 40.1 41.1 41.8  MCV 88.5 89.2 90.7 87.8 88.0  PLT 144* 164 194 246 262   Cardiac Enzymes: No results for input(s): CKTOTAL, CKMB, CKMBINDEX, TROPONINI in the last 168 hours. BNP: Invalid input(s): POCBNP CBG: Recent Labs  Lab 09/18/20 1113 09/18/20 1616 09/18/20 2127 09/19/20 0810 09/19/20 1123  GLUCAP 233* 266* 242* 144* 144*   D-Dimer No results for input(s): DDIMER in the last 72 hours. Hgb A1c No results for input(s): HGBA1C in the last 72 hours. Lipid Profile No results for  input(s): CHOL, HDL, LDLCALC, TRIG, CHOLHDL, LDLDIRECT in the last 72 hours. Thyroid function studies No results for input(s): TSH, T4TOTAL, T3FREE, THYROIDAB in the last 72 hours.  Invalid input(s): FREET3 Anemia work up No results for input(s): VITAMINB12, FOLATE, FERRITIN, TIBC, IRON, RETICCTPCT in the last 72 hours. Urinalysis No results found for: COLORURINE, APPEARANCEUR, LABSPEC, PHURINE, GLUCOSEU, HGBUR, BILIRUBINUR, KETONESUR, PROTEINUR, UROBILINOGEN, NITRITE, LEUKOCYTESUR Sepsis Labs Invalid input(s): PROCALCITONIN,  WBC,  LACTICIDVEN Microbiology Recent Results (from the past 240 hour(s))  SARS Coronavirus 2 by RT PCR (hospital order, performed in [facility] hospital lab) Nasopharyngeal Nasopharyngeal Swab     Status: Abnormal   Collection Time: 09/15/20  4:49 PM   Specimen: Nasopharyngeal Swab  Result Value Ref Range Status   SARS Coronavirus 2 POSITIVE (A) NEGATIVE Final    Comment: RESULT CALLED TO, READ BACK BY AND VERIFIED WITH: HUNTER ORE AT 1741 09/15/20.PMF (NOTE) SARS-CoV-2 target nucleic acids are DETECTED  SARS-CoV-2 RNA is generally detectable in upper respiratory specimens  during the acute phase of infection.  Positive results are indicative  of the presence of the identified virus, but do not rule out bacterial infection or co-infection with other pathogens not detected by the test.  Clinical correlation with patient history and  other diagnostic information is necessary to determine patient infection status.  The expected result is negative.  Fact Sheet for Patients:   BoilerBrush.com.cy   Fact Sheet for Healthcare Providers:   https://pope.com/    This test is not yet approved or cleared by the [place] FDA and  has been authorized for detection and/or diagnosis of SARS-CoV-2 by FDA under an Emergency Use Authorization (EUA).  This EUA will remain in effect (meaning this test c an be used) for the  duration of  the COVID-19 declaration under Section 564(b)(1) of the Act, 21 U.S.C. section 360-bbb-3(b)(1), unless the authorization is terminated or revoked sooner.  Performed at [facility], [address] [place], [place] [address]      Total time spend on discharging this patient, including the last patient exam, discussing the hospital stay, instructions for ongoing care as it relates to all pertinent caregivers, as well as preparing the medical discharge  records, prescriptions, and/or referrals as applicable, is 45 minutes.    Darlin Priestly, MD  [facility] 09/19/2020, 12:45 PM  If 7PM-7AM, please contact night-coverage

## 2020-09-19 NOTE — TOC Progression Note (Addendum)
Transition of Care [facility]) - Progression Note    Patient Details  Name: Andrew Palmer MRN: [id number] Date of Birth: Sep 23, 1956  Transition of Care [facility]) CM/SW Contact  Litchfield Cellar, RN Phone Number: 09/19/2020, 2:46 PM  Clinical Narrative:     Provided patient with [facility] application and [facility] sheet in [place]. Provided patient with pulse ox for home use. Confirmed by [employer] has been delivered. Patient cleared for discharge from [facility]    Expected Discharge Plan: Home/Self Care Barriers to Discharge: Continued Medical Work up  Expected Discharge Plan and Services Expected Discharge Plan: Home/Self Care   Discharge Planning Services: CM Consult   Living arrangements for the past 2 months: Single Family Home (rents a room) Expected Discharge Date: 09/19/20               DME Arranged: Oxygen DME Agency: [employer] Date DME Agency Contacted: 09/18/20 Time DME Agency Contacted: 1728 Representative spoke with at DME Agency: Oletha Cruel HH Arranged: NA           Social Determinants of Health (SDOH) Interventions    Readmission Risk Interventions No flowsheet data found.

## 2020-10-28 DEATH — deceased

## 2022-03-02 IMAGING — CR DG CHEST 2V
2 series · 2 of 2 positions shown · non-contrast
Comparison: None.

CLINICAL DATA: History of COVID pneumonia.  Former smoker.

EXAM:
CHEST - 2 VIEW

[chest pa]
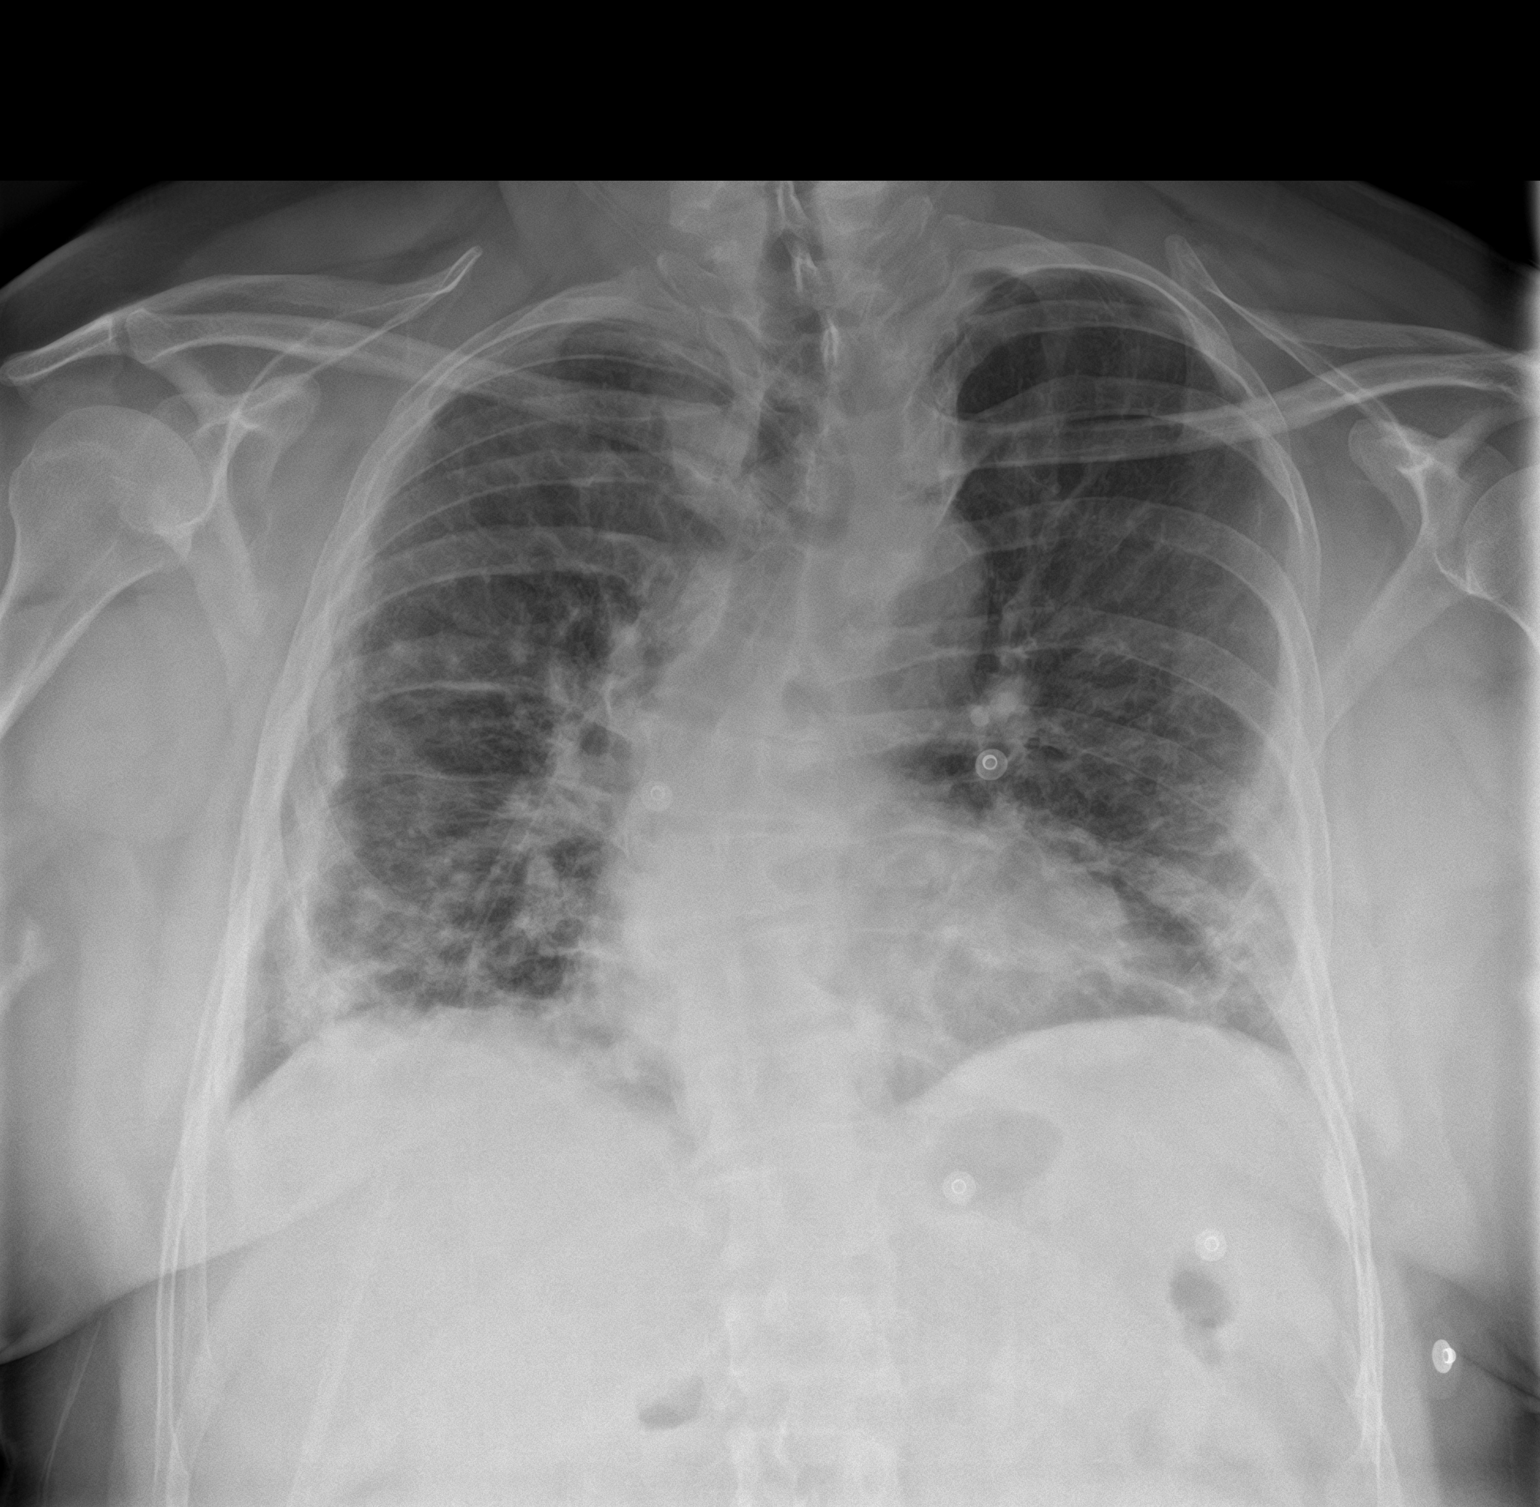

[chest lat]
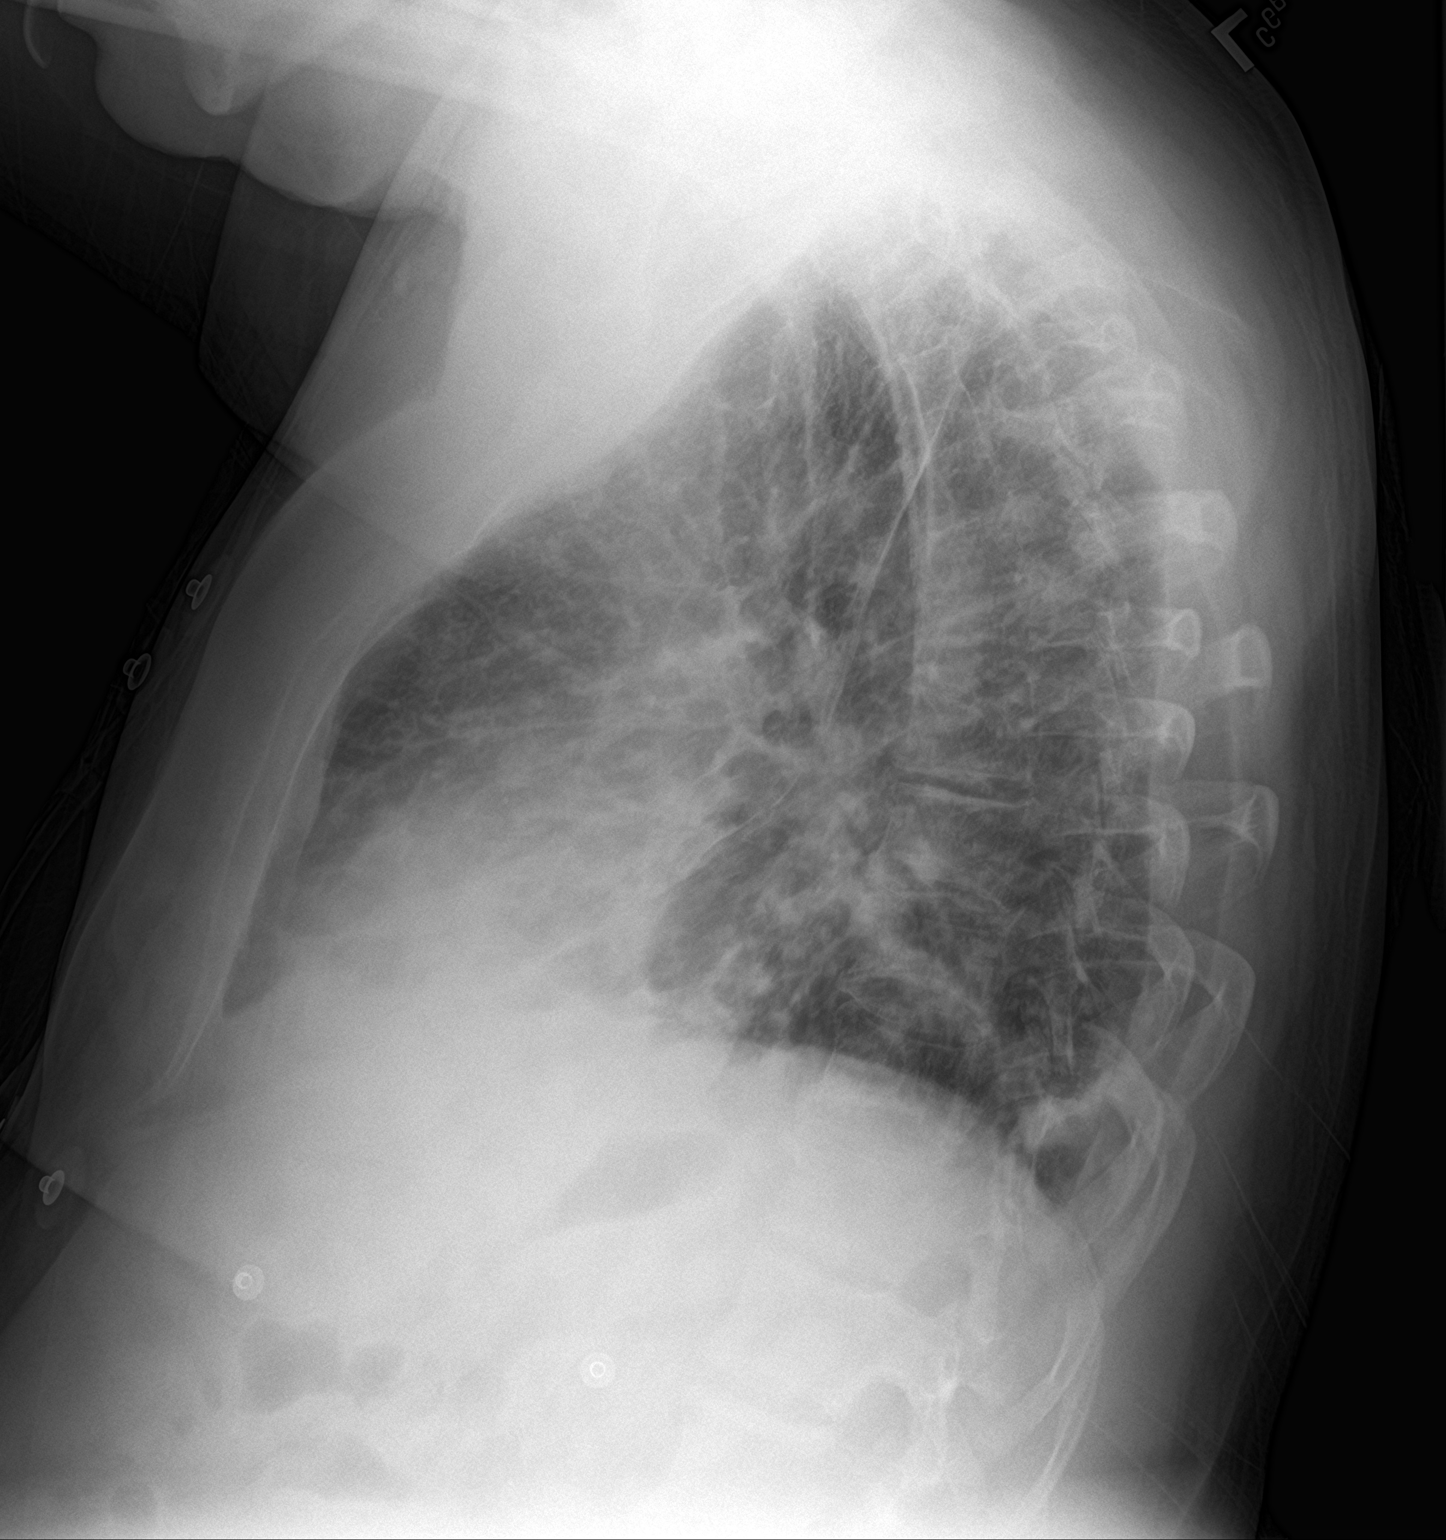

[2 of 2 positions shown; findings below may reference images not displayed]

FINDINGS: Normal cardiac silhouette and mediastinal contours. Extensive
bilateral peripheral and basilar predominant nodular interstitial
and airspace opacities with relative sparing of the right
costophrenic angle. No definite pleural effusion or pneumothorax. No
evidence of edema. No acute osseous abnormalities. Mild-to-moderate
focal scoliotic curvature of the thoracolumbar spine with dominant
mid component convex to the right.
IMPRESSION: Rather extensive bilateral mid and lower lung peripheral predominant
interstitial and nodular airspace opacities compatible with provided
history of COVID pneumonia.
